# Patient Record
Sex: Female | Born: 1970 | Race: Black or African American | Hispanic: No | Marital: Married | Smoking: Never smoker
Health system: Southern US, Community
[De-identification: ages and names within clinical notes are randomized; demographics above are authoritative.]

## PROBLEM LIST (undated history)

## (undated) DIAGNOSIS — D219 Benign neoplasm of connective and other soft tissue, unspecified: Secondary | ICD-10-CM

## (undated) DIAGNOSIS — F419 Anxiety disorder, unspecified: Secondary | ICD-10-CM

## (undated) DIAGNOSIS — K819 Cholecystitis, unspecified: Secondary | ICD-10-CM

## (undated) DIAGNOSIS — I82409 Acute embolism and thrombosis of unspecified deep veins of unspecified lower extremity: Secondary | ICD-10-CM

## (undated) HISTORY — PX: TUBAL LIGATION: SHX77

## (undated) HISTORY — DX: Benign neoplasm of connective and other soft tissue, unspecified: D21.9

## (undated) HISTORY — PX: BREAST BIOPSY: SHX20

## (undated) HISTORY — DX: Acute embolism and thrombosis of unspecified deep veins of unspecified lower extremity: I82.409

## (undated) HISTORY — DX: Anxiety disorder, unspecified: F41.9

---

## 1998-05-14 ENCOUNTER — Encounter: Admission: RE | Admit: 1998-05-14 | Discharge: 1998-05-14 | Payer: Self-pay | Admitting: Family Medicine

## 1999-03-01 ENCOUNTER — Encounter: Admission: RE | Admit: 1999-03-01 | Discharge: 1999-03-01 | Payer: Self-pay | Admitting: Family Medicine

## 1999-04-08 ENCOUNTER — Encounter: Admission: RE | Admit: 1999-04-08 | Discharge: 1999-04-08 | Payer: Self-pay | Admitting: Family Medicine

## 2000-04-07 ENCOUNTER — Encounter: Admission: RE | Admit: 2000-04-07 | Discharge: 2000-04-07 | Payer: Self-pay | Admitting: Family Medicine

## 2000-11-06 ENCOUNTER — Emergency Department (HOSPITAL_COMMUNITY): Admission: EM | Admit: 2000-11-06 | Discharge: 2000-11-07 | Payer: Self-pay | Admitting: Emergency Medicine

## 2001-05-01 ENCOUNTER — Encounter: Admission: RE | Admit: 2001-05-01 | Discharge: 2001-05-01 | Payer: Self-pay | Admitting: Sports Medicine

## 2001-05-01 ENCOUNTER — Encounter: Payer: Self-pay | Admitting: Sports Medicine

## 2002-03-11 ENCOUNTER — Encounter: Admission: RE | Admit: 2002-03-11 | Discharge: 2002-03-11 | Payer: Self-pay | Admitting: Family Medicine

## 2002-04-21 ENCOUNTER — Emergency Department (HOSPITAL_COMMUNITY): Admission: EM | Admit: 2002-04-21 | Discharge: 2002-04-21 | Payer: Self-pay | Admitting: Emergency Medicine

## 2002-04-24 ENCOUNTER — Encounter: Admission: RE | Admit: 2002-04-24 | Discharge: 2002-04-24 | Payer: Self-pay | Admitting: Sports Medicine

## 2002-04-26 ENCOUNTER — Encounter: Admission: RE | Admit: 2002-04-26 | Discharge: 2002-04-26 | Payer: Self-pay | Admitting: Family Medicine

## 2003-05-07 ENCOUNTER — Emergency Department (HOSPITAL_COMMUNITY): Admission: EM | Admit: 2003-05-07 | Discharge: 2003-05-08 | Payer: Self-pay | Admitting: Emergency Medicine

## 2003-05-08 ENCOUNTER — Emergency Department (HOSPITAL_COMMUNITY): Admission: EM | Admit: 2003-05-08 | Discharge: 2003-05-08 | Payer: Self-pay | Admitting: Emergency Medicine

## 2003-05-09 ENCOUNTER — Encounter: Admission: RE | Admit: 2003-05-09 | Discharge: 2003-05-09 | Payer: Self-pay | Admitting: Sports Medicine

## 2003-06-06 ENCOUNTER — Encounter: Admission: RE | Admit: 2003-06-06 | Discharge: 2003-06-06 | Payer: Self-pay | Admitting: Family Medicine

## 2004-04-06 ENCOUNTER — Ambulatory Visit: Payer: Self-pay | Admitting: Sports Medicine

## 2004-04-08 ENCOUNTER — Emergency Department (HOSPITAL_COMMUNITY): Admission: EM | Admit: 2004-04-08 | Discharge: 2004-04-09 | Payer: Self-pay | Admitting: Emergency Medicine

## 2004-04-23 ENCOUNTER — Ambulatory Visit: Payer: Self-pay | Admitting: Family Medicine

## 2004-09-23 ENCOUNTER — Emergency Department (HOSPITAL_COMMUNITY): Admission: EM | Admit: 2004-09-23 | Discharge: 2004-09-23 | Payer: Self-pay | Admitting: Emergency Medicine

## 2004-09-29 ENCOUNTER — Ambulatory Visit: Payer: Self-pay | Admitting: Family Medicine

## 2004-10-29 ENCOUNTER — Ambulatory Visit: Payer: Self-pay | Admitting: Family Medicine

## 2004-11-06 ENCOUNTER — Encounter (INDEPENDENT_AMBULATORY_CARE_PROVIDER_SITE_OTHER): Payer: Self-pay | Admitting: *Deleted

## 2004-11-06 LAB — CONVERTED CEMR LAB

## 2004-12-06 ENCOUNTER — Ambulatory Visit: Payer: Self-pay | Admitting: Sports Medicine

## 2005-04-25 ENCOUNTER — Ambulatory Visit: Payer: Self-pay | Admitting: Family Medicine

## 2005-07-07 ENCOUNTER — Ambulatory Visit: Payer: Self-pay | Admitting: Family Medicine

## 2005-07-15 ENCOUNTER — Ambulatory Visit: Payer: Self-pay | Admitting: Sports Medicine

## 2005-07-21 ENCOUNTER — Ambulatory Visit: Payer: Self-pay | Admitting: Family Medicine

## 2005-09-12 ENCOUNTER — Ambulatory Visit: Payer: Self-pay | Admitting: Family Medicine

## 2006-09-01 ENCOUNTER — Encounter (INDEPENDENT_AMBULATORY_CARE_PROVIDER_SITE_OTHER): Payer: Self-pay | Admitting: Family Medicine

## 2006-09-01 ENCOUNTER — Encounter (INDEPENDENT_AMBULATORY_CARE_PROVIDER_SITE_OTHER): Payer: Self-pay | Admitting: *Deleted

## 2006-09-01 ENCOUNTER — Ambulatory Visit: Payer: Self-pay | Admitting: Family Medicine

## 2006-10-09 ENCOUNTER — Telehealth (INDEPENDENT_AMBULATORY_CARE_PROVIDER_SITE_OTHER): Payer: Self-pay | Admitting: *Deleted

## 2006-10-11 ENCOUNTER — Ambulatory Visit: Payer: Self-pay | Admitting: Family Medicine

## 2007-03-21 ENCOUNTER — Telehealth (INDEPENDENT_AMBULATORY_CARE_PROVIDER_SITE_OTHER): Payer: Self-pay | Admitting: *Deleted

## 2007-03-22 ENCOUNTER — Encounter (INDEPENDENT_AMBULATORY_CARE_PROVIDER_SITE_OTHER): Payer: Self-pay | Admitting: Family Medicine

## 2007-03-22 ENCOUNTER — Encounter (INDEPENDENT_AMBULATORY_CARE_PROVIDER_SITE_OTHER): Payer: Self-pay | Admitting: *Deleted

## 2007-03-22 ENCOUNTER — Ambulatory Visit: Payer: Self-pay | Admitting: Family Medicine

## 2007-03-22 LAB — CONVERTED CEMR LAB: Hgb A1c MFr Bld: 5.9 %

## 2007-03-29 ENCOUNTER — Encounter (INDEPENDENT_AMBULATORY_CARE_PROVIDER_SITE_OTHER): Payer: Self-pay | Admitting: Family Medicine

## 2007-04-04 ENCOUNTER — Ambulatory Visit (HOSPITAL_COMMUNITY): Admission: RE | Admit: 2007-04-04 | Discharge: 2007-04-04 | Payer: Self-pay | Admitting: Sports Medicine

## 2007-04-04 ENCOUNTER — Encounter: Payer: Self-pay | Admitting: Family Medicine

## 2007-04-04 ENCOUNTER — Encounter (INDEPENDENT_AMBULATORY_CARE_PROVIDER_SITE_OTHER): Payer: Self-pay | Admitting: Family Medicine

## 2007-04-04 LAB — CONVERTED CEMR LAB
ALT: 9 units/L (ref 0–35)
AST: 14 units/L (ref 0–37)
Albumin: 4.1 g/dL (ref 3.5–5.2)
Alkaline Phosphatase: 57 units/L (ref 39–117)
BUN: 12 mg/dL (ref 6–23)
Basophils Absolute: 0 10*3/uL (ref 0.0–0.1)
Basophils Relative: 1 % (ref 0–1)
CO2: 22 meq/L (ref 19–32)
Calcium: 9.3 mg/dL (ref 8.4–10.5)
Chloride: 105 meq/L (ref 96–112)
Cholesterol: 169 mg/dL (ref 0–200)
Creatinine, Ser: 0.86 mg/dL (ref 0.40–1.20)
Eosinophils Absolute: 0.1 10*3/uL (ref 0.0–0.7)
Eosinophils Relative: 1 % (ref 0–5)
Glucose, Bld: 81 mg/dL (ref 70–99)
HCT: 37 % (ref 36.0–46.0)
HDL: 35 mg/dL — ABNORMAL LOW (ref >39)
Hemoglobin: 11.6 g/dL — ABNORMAL LOW (ref 12.0–15.0)
LDL Cholesterol: 117 mg/dL — ABNORMAL HIGH (ref 0–99)
Lymphocytes Relative: 45 % (ref 12–46)
Lymphs Abs: 2.9 10*3/uL (ref 0.7–3.3)
MCHC: 31.4 g/dL (ref 30.0–36.0)
MCV: 78.7 fL (ref 78.0–100.0)
Monocytes Absolute: 0.6 10*3/uL (ref 0.2–0.7)
Monocytes Relative: 9 % (ref 3–11)
Neutro Abs: 2.9 10*3/uL (ref 1.7–7.7)
Neutrophils Relative %: 44 % (ref 43–77)
Platelets: 344 10*3/uL (ref 150–400)
Potassium: 4.4 meq/L (ref 3.5–5.3)
RBC: 4.7 M/uL (ref 3.87–5.11)
RDW: 15.1 % — ABNORMAL HIGH (ref 11.5–14.0)
Sodium: 136 meq/L (ref 135–145)
TSH: 0.823 microintl units/mL (ref 0.350–5.50)
Total Bilirubin: 0.5 mg/dL (ref 0.3–1.2)
Total CHOL/HDL Ratio: 4.8
Total Protein: 7.2 g/dL (ref 6.0–8.3)
Triglycerides: 86 mg/dL (ref <150)
VLDL: 17 mg/dL (ref 0–40)
WBC: 6.5 10*3/uL (ref 4.0–10.5)

## 2007-04-06 ENCOUNTER — Encounter (INDEPENDENT_AMBULATORY_CARE_PROVIDER_SITE_OTHER): Payer: Self-pay | Admitting: Family Medicine

## 2007-04-06 ENCOUNTER — Ambulatory Visit: Payer: Self-pay | Admitting: Family Medicine

## 2007-04-09 ENCOUNTER — Encounter (INDEPENDENT_AMBULATORY_CARE_PROVIDER_SITE_OTHER): Payer: Self-pay | Admitting: *Deleted

## 2007-04-09 LAB — CONVERTED CEMR LAB
Ferritin: 14 ng/mL (ref 10–291)
Iron: 37 ug/dL — ABNORMAL LOW (ref 42–145)
Saturation Ratios: 9 % — ABNORMAL LOW (ref 20–55)
TIBC: 423 ug/dL (ref 250–470)
UIBC: 386 ug/dL

## 2007-04-11 ENCOUNTER — Encounter (INDEPENDENT_AMBULATORY_CARE_PROVIDER_SITE_OTHER): Payer: Self-pay | Admitting: Family Medicine

## 2007-04-19 ENCOUNTER — Encounter: Payer: Self-pay | Admitting: Sports Medicine

## 2007-04-19 ENCOUNTER — Ambulatory Visit (HOSPITAL_COMMUNITY): Admission: RE | Admit: 2007-04-19 | Discharge: 2007-04-19 | Payer: Self-pay | Admitting: Sports Medicine

## 2007-04-24 ENCOUNTER — Encounter (INDEPENDENT_AMBULATORY_CARE_PROVIDER_SITE_OTHER): Payer: Self-pay | Admitting: Family Medicine

## 2008-06-12 ENCOUNTER — Emergency Department (HOSPITAL_COMMUNITY): Admission: EM | Admit: 2008-06-12 | Discharge: 2008-06-12 | Payer: Self-pay | Admitting: Emergency Medicine

## 2008-06-13 ENCOUNTER — Telehealth: Payer: Self-pay | Admitting: *Deleted

## 2008-06-16 ENCOUNTER — Encounter: Payer: Self-pay | Admitting: Family Medicine

## 2008-06-16 ENCOUNTER — Ambulatory Visit: Payer: Self-pay | Admitting: Family Medicine

## 2008-06-16 LAB — CONVERTED CEMR LAB
Basophils Absolute: 0 10*3/uL (ref 0.0–0.1)
Basophils Relative: 1 % (ref 0–1)
Eosinophils Absolute: 0.2 10*3/uL (ref 0.0–0.7)
Eosinophils Relative: 4 % (ref 0–5)
HCT: 35 % — ABNORMAL LOW (ref 36.0–46.0)
Hemoglobin: 10.5 g/dL — ABNORMAL LOW (ref 12.0–15.0)
Lymphocytes Relative: 32 % (ref 12–46)
Lymphs Abs: 1.5 10*3/uL (ref 0.7–4.0)
MCHC: 30 g/dL (ref 30.0–36.0)
MCV: 83.1 fL (ref 78.0–100.0)
Monocytes Absolute: 0.7 10*3/uL (ref 0.1–1.0)
Monocytes Relative: 15 % — ABNORMAL HIGH (ref 3–12)
Neutro Abs: 2.4 10*3/uL (ref 1.7–7.7)
Neutrophils Relative %: 50 % (ref 43–77)
Platelets: 316 10*3/uL (ref 150–400)
RBC: 4.21 M/uL (ref 3.87–5.11)
RDW: 14.5 % (ref 11.5–15.5)
TSH: 1.016 microintl units/mL (ref 0.350–4.50)
WBC: 4.9 10*3/uL (ref 4.0–10.5)

## 2008-06-20 ENCOUNTER — Encounter: Payer: Self-pay | Admitting: Family Medicine

## 2008-06-30 ENCOUNTER — Telehealth: Payer: Self-pay | Admitting: Family Medicine

## 2008-07-01 ENCOUNTER — Telehealth: Payer: Self-pay | Admitting: *Deleted

## 2008-07-04 HISTORY — PX: VAGINAL HYSTERECTOMY: SUR661

## 2008-07-11 ENCOUNTER — Encounter: Payer: Self-pay | Admitting: Family Medicine

## 2008-07-11 ENCOUNTER — Ambulatory Visit: Payer: Self-pay | Admitting: Family Medicine

## 2008-07-15 LAB — CONVERTED CEMR LAB
Ferritin: 7 ng/mL — ABNORMAL LOW (ref 10–291)
Iron: 37 ug/dL — ABNORMAL LOW (ref 42–145)
Pap Smear: NORMAL
Saturation Ratios: 9 % — ABNORMAL LOW (ref 20–55)
TIBC: 390 ug/dL (ref 250–470)
UIBC: 353 ug/dL

## 2008-07-30 ENCOUNTER — Telehealth: Payer: Self-pay | Admitting: Family Medicine

## 2008-07-31 ENCOUNTER — Telehealth (INDEPENDENT_AMBULATORY_CARE_PROVIDER_SITE_OTHER): Payer: Self-pay | Admitting: Family Medicine

## 2008-08-01 ENCOUNTER — Ambulatory Visit: Payer: Self-pay | Admitting: Family Medicine

## 2008-08-14 ENCOUNTER — Encounter: Payer: Self-pay | Admitting: Family Medicine

## 2008-08-24 ENCOUNTER — Encounter: Admission: RE | Admit: 2008-08-24 | Discharge: 2008-08-24 | Payer: Self-pay | Admitting: Otolaryngology

## 2008-10-23 ENCOUNTER — Telehealth: Payer: Self-pay | Admitting: Family Medicine

## 2008-10-23 ENCOUNTER — Encounter: Payer: Self-pay | Admitting: Family Medicine

## 2008-10-23 ENCOUNTER — Ambulatory Visit: Payer: Self-pay | Admitting: Family Medicine

## 2008-10-26 ENCOUNTER — Emergency Department (HOSPITAL_COMMUNITY): Admission: EM | Admit: 2008-10-26 | Discharge: 2008-10-26 | Payer: Self-pay | Admitting: Emergency Medicine

## 2008-10-26 ENCOUNTER — Telehealth (INDEPENDENT_AMBULATORY_CARE_PROVIDER_SITE_OTHER): Payer: Self-pay | Admitting: Family Medicine

## 2008-10-27 ENCOUNTER — Telehealth: Payer: Self-pay | Admitting: Family Medicine

## 2008-10-27 ENCOUNTER — Ambulatory Visit: Payer: Self-pay | Admitting: Family Medicine

## 2008-10-27 DIAGNOSIS — D259 Leiomyoma of uterus, unspecified: Secondary | ICD-10-CM | POA: Insufficient documentation

## 2008-10-28 ENCOUNTER — Telehealth: Payer: Self-pay | Admitting: Family Medicine

## 2008-10-28 ENCOUNTER — Telehealth: Payer: Self-pay | Admitting: *Deleted

## 2008-10-30 ENCOUNTER — Ambulatory Visit: Payer: Self-pay | Admitting: Obstetrics and Gynecology

## 2008-11-04 ENCOUNTER — Telehealth: Payer: Self-pay | Admitting: Family Medicine

## 2008-11-14 ENCOUNTER — Ambulatory Visit: Payer: Self-pay | Admitting: Obstetrics and Gynecology

## 2008-11-18 ENCOUNTER — Encounter: Payer: Self-pay | Admitting: Obstetrics and Gynecology

## 2008-11-18 ENCOUNTER — Ambulatory Visit: Payer: Self-pay | Admitting: Obstetrics and Gynecology

## 2008-11-18 ENCOUNTER — Encounter: Payer: Self-pay | Admitting: Family Medicine

## 2008-11-18 ENCOUNTER — Ambulatory Visit (HOSPITAL_BASED_OUTPATIENT_CLINIC_OR_DEPARTMENT_OTHER): Admission: RE | Admit: 2008-11-18 | Discharge: 2008-11-19 | Payer: Self-pay | Admitting: Obstetrics and Gynecology

## 2008-11-22 ENCOUNTER — Ambulatory Visit: Payer: Self-pay | Admitting: Obstetrics and Gynecology

## 2008-11-22 ENCOUNTER — Inpatient Hospital Stay (HOSPITAL_COMMUNITY): Admission: AD | Admit: 2008-11-22 | Discharge: 2008-11-22 | Payer: Self-pay | Admitting: Gynecology

## 2008-12-09 ENCOUNTER — Ambulatory Visit: Payer: Self-pay | Admitting: Obstetrics and Gynecology

## 2009-01-07 ENCOUNTER — Ambulatory Visit: Payer: Self-pay | Admitting: Obstetrics and Gynecology

## 2009-02-09 ENCOUNTER — Ambulatory Visit: Payer: Self-pay | Admitting: Family Medicine

## 2009-02-09 LAB — CONVERTED CEMR LAB
Bilirubin Urine: NEGATIVE
Glucose, Urine, Semiquant: NEGATIVE
Ketones, urine, test strip: NEGATIVE
Nitrite: NEGATIVE
Protein, U semiquant: >=300
Specific Gravity, Urine: 1.02
Urobilinogen, UA: 0.2
pH: 6.5

## 2009-02-10 ENCOUNTER — Encounter: Payer: Self-pay | Admitting: Family Medicine

## 2009-10-28 ENCOUNTER — Ambulatory Visit: Payer: Self-pay | Admitting: Obstetrics and Gynecology

## 2009-10-28 ENCOUNTER — Other Ambulatory Visit: Admission: RE | Admit: 2009-10-28 | Discharge: 2009-10-28 | Payer: Self-pay | Admitting: Obstetrics and Gynecology

## 2009-11-11 ENCOUNTER — Ambulatory Visit: Payer: Self-pay | Admitting: Obstetrics and Gynecology

## 2009-11-25 ENCOUNTER — Ambulatory Visit: Payer: Self-pay | Admitting: Obstetrics and Gynecology

## 2010-01-08 ENCOUNTER — Emergency Department (HOSPITAL_COMMUNITY): Admission: EM | Admit: 2010-01-08 | Discharge: 2010-01-09 | Payer: Self-pay | Admitting: Emergency Medicine

## 2010-05-07 ENCOUNTER — Ambulatory Visit: Payer: Self-pay | Admitting: Women's Health

## 2010-07-03 ENCOUNTER — Emergency Department (HOSPITAL_COMMUNITY)
Admission: EM | Admit: 2010-07-03 | Discharge: 2010-07-03 | Disposition: A | Discharge status: Other Acute Inpatient Hospital | Payer: Self-pay | Source: Home / Self Care | Admitting: Emergency Medicine

## 2010-07-03 ENCOUNTER — Inpatient Hospital Stay (HOSPITAL_COMMUNITY)
Admission: EM | Admit: 2010-07-03 | Discharge: 2010-07-06 | Payer: Self-pay | Attending: Family Medicine | Admitting: Family Medicine

## 2010-07-03 ENCOUNTER — Encounter (INDEPENDENT_AMBULATORY_CARE_PROVIDER_SITE_OTHER): Payer: Self-pay | Admitting: Emergency Medicine

## 2010-07-09 ENCOUNTER — Encounter: Payer: Self-pay | Admitting: *Deleted

## 2010-07-09 ENCOUNTER — Ambulatory Visit: Admit: 2010-07-09 | Payer: Self-pay

## 2010-07-09 ENCOUNTER — Ambulatory Visit: Admission: RE | Admit: 2010-07-09 | Discharge: 2010-07-09 | Payer: Self-pay | Source: Home / Self Care

## 2010-07-09 DIAGNOSIS — M5430 Sciatica, unspecified side: Secondary | ICD-10-CM | POA: Insufficient documentation

## 2010-07-09 DIAGNOSIS — I82409 Acute embolism and thrombosis of unspecified deep veins of unspecified lower extremity: Secondary | ICD-10-CM | POA: Insufficient documentation

## 2010-07-09 LAB — CONVERTED CEMR LAB: INR: 1.7

## 2010-07-13 ENCOUNTER — Ambulatory Visit: Admission: RE | Admit: 2010-07-13 | Discharge: 2010-07-13 | Payer: Self-pay | Source: Home / Self Care

## 2010-07-13 ENCOUNTER — Encounter: Payer: Self-pay | Admitting: Pharmacist

## 2010-07-13 LAB — CONVERTED CEMR LAB: INR: 1.8

## 2010-07-15 ENCOUNTER — Ambulatory Visit: Admission: RE | Admit: 2010-07-15 | Discharge: 2010-07-15 | Payer: Self-pay | Source: Home / Self Care

## 2010-07-15 ENCOUNTER — Telehealth: Payer: Self-pay | Admitting: Family Medicine

## 2010-07-19 ENCOUNTER — Ambulatory Visit: Admission: RE | Admit: 2010-07-19 | Discharge: 2010-07-19 | Payer: Self-pay | Source: Home / Self Care

## 2010-07-19 LAB — CONVERTED CEMR LAB: INR: 2.4

## 2010-07-26 ENCOUNTER — Ambulatory Visit: Admission: RE | Admit: 2010-07-26 | Discharge: 2010-07-26 | Payer: Self-pay | Source: Home / Self Care

## 2010-07-26 LAB — CONVERTED CEMR LAB: INR: 2.3

## 2010-07-30 NOTE — H&P (Signed)
Carla Alexander, HEPP              ACCOUNT NO.:  [id number]  MEDICAL RECORD NO.:  [id number]          PATIENT TYPE:  INP  LOCATION:  5501                         FACILITY:  [facility]  PHYSICIAN:  Annalysia Willenbring A. Sheffield Slider, M.D.    DATE OF BIRTH:  Jul 21, 1970  DATE OF ADMISSION:  07/03/2010 DATE OF DISCHARGE:                             HISTORY & PHYSICAL   PRIMARY CARE PHYSICIAN:  [facility].  CHIEF COMPLAINT:  Left leg pain.  HISTORY OF PRESENT ILLNESS:  Carla Alexander is a 40 year old female with no significant past medical history who presented earlier today to the [facility] ED for 3 weeks' history of pain in her left leg radiating from her left buttock to her thigh.  She also describes some shortness of breath and chest pain for about the same period of time.  She describes numbness and tingling in the long left lateral aspect of her thigh radiating to the whole foot and also radiating to her entire foot.It has been progressively worsened to the point where she came to the emergency department today.  She states that she walks with a limp secondary to pain, but does not need any assistance devices.  In the emergency room, she was diagnosed with left popliteal DVT.  A CT angiogram of the chest obtained and at that time it was negative PE. Denies any cough, palpitations, lower extremity swelling, abdominal pain, nausea, or vomiting.  PAST MEDICAL HISTORY: 1. Uterine fibroids. 2. Lumbago.  PAST SURGICAL HISTORY: 1. Bilateral tubal ligation in 1991. 2. Lumpectomy of left breast which is benign in 1991.  MEDICATIONS:  None.  ALLERGIES:  No known drug allergies.  FAMILY HISTORY:  History of postpartum cardiomyopathy.  Mother with diabetes mellitus type 2.  Sister also with diabetes mellitus type 2. No history of breast cancer.  SOCIAL HISTORY:  The patient is married and lives at home with 2 children and her husband.  She is a Geologist, engineering for kindergarten. Denies any  tobacco, ethanol, or drug abuse.  REVIEW OF SYSTEMS:  A 10-point review of systems is completely negative except for HPI.  PHYSICAL EXAMINATION:  GENERAL:  A well-developed, well-nourished, in no apparent distress female. HEENT:  Normocephalic and atraumatic.  PERRL.  EOMI.  Mucous membranes moist. NECK:  No lymphadenopathy.  Supple.  Full range of motion. CARDIOVASCULAR:  Regular rate and rhythm.  No murmurs.  Good S1 and S2. LUNGS:  Clear to auscultation bilaterally. ABDOMEN:  Obese, nontender, and nondistended.  Positive bowel sounds in all 4 quadrants. EXTREMITIES:  Warm and well perfused throughout.  Does have some mild tenderness on palpation of both calves and upper thigh of left leg. Unable to localize the exact pain.  Homans sign positive in left leg. No redness or edema noted in the bilateral lower extremities.  DP and PT pulses are +2 bilaterally. SKIN:  No rashes or lesions on visible skin. NEURO:  Alert and oriented x3.  Cranial nerves II-XII are intact. Sensation intact to upper and lower extremities.  Motor function is intact throughout.  DTRs are grossly diminished throughout all reflex areas.  Deferred gait exam.  Finger-nose  within normal limits. MUSCULOSKELETAL:  Strength 5/5 in the bilateral lower extremities except for left lower extremity.  Pain limits the strength examination in the left lower extremity.  The patient is hesitant to cooperate with exam secondary to this pain. PSYCH:  No suicidal or homicidal ideations.  Not depressed or anxious.  LABORATORY DATA:  BMET shows sodium 142, potassium 3.5, chloride 106, bicarb 26, BUN 7, creatinine 0.9, and glucose 107.  CBC showed WBC of 9.5, hemoglobin 12.4, hematocrit 38.6, and platelets 235.  IMAGING: 1. Lower extremity Doppler showed left popliteal DVT, preliminary     reading. 2. CT of the chest showed no evidence of PE with mildly prominent     thymic soft tissue for her age.  ASSESSMENT/PLAN:  Ms.  Carla Alexander is a 39-year female with new-onset left lower deep vein thrombosis, admission for the same. 1. Left deep vein thrombosis.  I will plan to start the patient on     Lovenox.  In the emergency room at [facility], we attempted     patient education.  The patient was unable to grasp giving herself     Lovenox injections.  Per the patient, no one as available to teach     her husband when he arrived.  She does believe he can learn Lovenox     injections to have her.  For now, we will bridge Lovenox to cover     until the therapeutic INR with Coumadin.  No evidence of PE on     exam.  The patient is no longer complaining of chest pain or     shortness of breath.  However with planning this today and     admission to emergency department.  Treatment for PE will be     anticoagulation anyway.  However, due to persistence of symptoms     and negative CTA, we will cycle cardiac enzymes.  I also plan to     risk stratify the patient further with A1c if she has not been seen     in clinic for over a year.  She has new-onset DVT.  I feel that TSH     and lipids would be better obtained when the patient is not in  distress. 2. Pain:  We will cover with Tylenol for now.  Pain does not seem to     related to location of DVT.  The patient is very upset about her     diagnosis and does not want to cooperate with physical exam,     examination of strength of left lower extremity.  We would like to     reevaluate with more complete exam in the morning when she has the     ability to fully process DVT diagnosis.  This sounds like sciatica     or piriformis syndrome with pain in her hip and paresthesias     radiating to back, thigh, and foot. 3. Fluids, electrolytes, nutrition and gastrointestinal:  Regular     adult diet. 4. Prophylaxis.  Lovenox per Pharmacy. 5. Disposition.  Hopeful for discharging home tomorrow after Lovenox     teaching.     Renold Don,  MD   ______________________________ Arnette Norris Sheffield Slider, M.D.    JW/MEDQ  D:  07/03/2010  T:  07/04/2010  Job:  [id number]  Electronically Signed by Renold Don  on 07/16/2010 08:49:21 AM Electronically Signed by Zachery Dauer M.D. on 07/30/2010 06:23:00 PM

## 2010-08-03 ENCOUNTER — Ambulatory Visit: Admission: RE | Admit: 2010-08-03 | Discharge: 2010-08-03 | Payer: Self-pay | Source: Home / Self Care

## 2010-08-03 LAB — CONVERTED CEMR LAB: INR: 2.2
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
## 2010-08-05 NOTE — Letter (Signed)
Summary: Out of Work  [facility]  [address]   [place], [place] [address]   Phone: [phone]  Fax: [phone]    July 09, 2010   Employee:  REYNA LORENZI    To Whom It May Concern:   For Medical reasons, please excuse the above named employee from work for the following dates:  Start:   July 09, 2010  End:   July 16, 2010  If you need additional information, please feel free to contact our office.         Sincerely,    Loralee Pacas CMA

## 2010-08-05 NOTE — Progress Notes (Signed)
Summary: Dental appt  Phone Note Call from Patient Call back at Home Phone [phone]   Reason for Call: Talk to Nurse Summary of Call: has dentist appt mon, pt is on a blood thinner & thinks she needs an abx before going to the dentist so she doesnt get an infection.  Initial call taken by: Knox Royalty,  July 15, 2010 3:07 PM  Follow-up for Phone Call        she has her regular checkup next week. denies hx of cardiac problems or valve replacement. states her sister told her she needed antibiotics. told her not needed if no problems as above. she takes warfarin. told her that would not make her need antibiotics. she was ok with answer Follow-up by: Golden Circle RN,  July 15, 2010 3:34 PM  Additional Follow-up for Phone Call Additional follow up Details #1::        Appreciate help.  Agree with plan.   Additional Follow-up by: Renold Don MD,  July 17, 2010 12:02 PM

## 2010-08-05 NOTE — Miscellaneous (Signed)
Summary: anticoagulation   Clinical Lists Changes  Patient arrives for INR assessment. INR was 1.8 and patient is currently taking 10 mg daily of warfarin. Asked to help give Lovenox injection. Patient's husband usually gives her injections at home. Gave 60 mg injection and 40 mg injection to make 100 mg.  Education and Injection instruction provided by Margot Chimes, PharmD candidate and Lyna Poser, PharmD   Medications: Added new medication of LOVENOX 100 MG/ML SOLN (ENOXAPARIN SODIUM) Inject 100 mg subcutaneously two times a day - Signed Added new medication of NITROSTAT 0.4 MG SUBL (NITROGLYCERIN) One by mouth sublingual q23min x 3 for chest pain. - Signed Rx of LOVENOX 100 MG/ML SOLN (ENOXAPARIN SODIUM) Inject 100 mg subcutaneously two times a day;  #1 x 0;  Signed;  Entered by: Lyna Poser PharmD;  Authorized by: Lyna Poser PharmD;  Method used: Historical Rx of NITROSTAT 0.4 MG SUBL (NITROGLYCERIN) One by mouth sublingual q49min x 3 for chest pain.;  #1 box x 0;  Signed;  Entered by: Rodney Langton MD;  Authorized by: Rodney Langton MD;  Method used: [facility]  [address] [phone]*, [address], [place], [place], [place]  [id number], Ph: [phone] or [phone], Fax: [phone]    Prescriptions: NITROSTAT 0.4 MG SUBL (NITROGLYCERIN) One by mouth sublingual q10min x 3 for chest pain.  #1 box x 0   Entered and Authorized by:   Rodney Langton MD   Signed by:   Rodney Langton MD on 07/13/2010   Method used:   [facility]        [facility]  [address] [phone]* (retail)       [address]       [place], [place]  [id number]       Ph: [phone] or [phone]       Fax: [phone]   RxID:   [id number] LOVENOX 100 MG/ML SOLN (ENOXAPARIN SODIUM) Inject 100 mg subcutaneously two times a day  #1 x 0   Entered and Authorized by:   Lyna Poser PharmD   Signed by:   Rodney Langton MD  on 07/13/2010   Method used:   Historical   RxID:   [id number]

## 2010-08-05 NOTE — Assessment & Plan Note (Signed)
Summary: needs rx/meet new md/INR ck/eo   Vital Signs:  Patient profile:   40 year old female Height:      68.5 inches Weight:      250 pounds BMI:     37.59 Temp:     98.6 degrees F oral Pulse rate:   89 / minute Pulse rhythm:   regular BP sitting:   140 / 90  (right arm) Cuff size:   regular  Vitals Entered By: Loralee Pacas CMA (July 15, 2010 8:53 AM) CC: meet new pcp and rx Pain Assessment Patient in pain? yes        Primary Care Provider:  . [facility]  CC:  meet new pcp and rx.  History of Present Illness: 1.  DVT FU:  Still taking Wafarin.  INR 1.5 today, was 1.7 on 1/10.  Taking 10 mg daily.  Also on Lovenox.    2.  Chest Pain:  Had episode of 10 minutes of chest pain after leaving [facility] on 1/10.  Tightness in her chest, felt like she couldn't talk or swallow.  No shortness of breath.  Complained of dizziness.  Also having similar symptoms in hospital.  Was given Nitroglycerin at that appt but did not take it.  Normal stress test Oct 2008 in cardiology office.  Relieved with rest.  No further symptoms.  No diaphoresis or N/V.  3.  Pain:  still with pain in hip and radiating down leg in sciatic distribution.  Feels it is worse today than yesterday.  Has been doing stretches.  Would like refill on hydrocodone.    ROS:  no headaches, pre-syncopal or syncopal episodes,  palpitations, shortness of breath or dyspnea, abdominal pain, diarrhea or constipation, melena, hematochezia, lower extremity swelling.    Habits & Providers  Alcohol-Tobacco-Diet     Tobacco Status: never  Current Problems (verified): 1)  Long-term (CURRENT) Use of Anticoagulants  (ICD-V58.61) 2)  Sciatica, Left  (ICD-724.3) 3)  Deep Venous Thrombophlebitis, Leg, Left  (ICD-453.40) 4)  Fibroids, Uterus  (ICD-218.9)  Current Medications (verified): 1)  Lovenox 100 Mg/ml Soln (Enoxaparin Sodium) .... Inject 100 Mg Subcutaneously Two Times A Day 2)  Hydrocodone-Acetaminophen 5-325 Mg Tabs  (Hydrocodone-Acetaminophen) .... Take 1 Pill Every 6 Hours If Needed For Pain 3)  Warfarin Sodium 10 Mg Tabs (Warfarin Sodium)  Allergies (verified): No Known Drug Allergies  Past History:  Past Surgical History: - ECHO: EF 60 %, no ventricular wall motion abnormalities. Normal anatomy( 10/08) -BTL - 12/02/1989 - lumpectomy-left breast-benign (1991)  - Normal Stress test Oct 2008  Physical Exam  General:  Vital signs reviewed. Well-developed, well-nourished patient in NAD.  Awake and cooperative  Lungs:  clear to auscultation bilaterally without wheezing, rales, or rhonchi.  Normal work of breathing  Heart:  Regular rate and rhythm without murmur, rub, or gallop.  Normal S1/S2  Msk:  LLE slightly swollen compared to right, homans sign positive on L, TTP in popliteal region.    Strength, sensation, and DTRs 5/5 and symmetric. Straight leg raise was positive for back pain. Her hip abductors and ext rotators are extremely tight.  Exam is severely limited 2/2 this. She is very tender over her piriformis and pressure reproduces neuropathic symptoms.  Pulses:  Good distal pulses BL LE's Neurologic:  sensation intact to light touch bilateral lower extremities and DTRs symmetrical and normal.     Impression & Recommendations:  Problem # 1:  DEEP VENOUS THROMBOPHLEBITIS, LEG, LEFT (ICD-453.40)  Increase Coumadin to 15 mg  x 2 days then back to 10.  FU next week lab appt.   Orders: [facility]- Est  Level 4 ([address]214)  Problem # 2:  SCIATICA, LEFT (ICD-724.3)  Recommended to continue stretches as prescribed.  Recommended physical therapy but patient declined at this time, will think about it in future.  Orders: [facility]- Est  Level 4 (04540)  Orders: [facility]- Est  Level 4 (98119)  Problem # 3:  CHEST PAIN (ICD-786.50)  Not cardiac pain.  Likely related to anxiety.  Not related to food intake.  Initially concerned for PE in patient with new DVT, but she has been taking Lovenox regularly and less  concerned after taking full history.  Gave strict red flags and reasons to call clinic or go to ED.  Low threshold for fu for chest pain.    Orders: [facility]- Est  Level 4 ([address]214)  Complete Medication List: 1)  Lovenox 100 Mg/ml Soln (Enoxaparin sodium) .... Inject 100 mg subcutaneously two times a day 2)  Hydrocodone-acetaminophen 5-325 Mg Tabs (Hydrocodone-acetaminophen) .... Take 1 pill every 6 hours if needed for pain 3)  Warfarin Sodium 10 Mg Tabs (Warfarin sodium)  Patient Instructions: 1)  We are refilling your pain medicines today. 2)  Keep up with being active and your stretching to help with the sciatic pain.  3)  Take the Warfarin as you discussed with Kendal Hymen.   4)  Make an appt to see me in 1 month so we can go over all of your medical history at that time and make sure you're doing well. 5)  If you're having any further chest pain, make sure you come back so we can see you then.  If you're having bad chest pain or trouble breathing, go to the ED.  6)  It was good to see you today! Prescriptions: HYDROCODONE-ACETAMINOPHEN 5-325 MG TABS (HYDROCODONE-ACETAMINOPHEN) Take 1 pill every 6 hours if needed for pain  #45 x 0   Entered and Authorized by:   Renold Don MD   Signed by:   Renold Don MD on 07/15/2010   Method used:   Print then Give to Patient   RxID:   [id number]    Orders Added: 1)  [facility]- Est  Level 4 [[address]]  Appended Document: INR  1.5    Lab Visit  Laboratory Results   Blood Tests      INR: 1.5   (Normal Range: 0.88-1.12   Therap INR: 2.0-3.5)   Orders Today:    ANTICOAGULATION RECORD PREVIOUS REGIMEN & LAB RESULTS Anticoagulation Diagnosis:  Deep venous thrombosis on  07/09/2010 Previous INR Goal Range:  2-3 on  07/09/2010 Previous INR:  1.8 on  07/13/2010 Previous Coumadin Dose(mg):  10mg  tablets on  07/09/2010 Previous Regimen:  10 mg warfarin daily;  100 mg lovenox BID on  07/13/2010  NEW REGIMEN & LAB RESULTS Current INR:  1.5 Regimen: 15 mg - today (Thurs),  10 mg - Fri & Sat;  15 mg - Sun;  continue Lovenox 100 mg BID Coagulation Comments: gave pt.

## 2010-08-13 ENCOUNTER — Ambulatory Visit (INDEPENDENT_AMBULATORY_CARE_PROVIDER_SITE_OTHER): Payer: BC Managed Care – PPO | Admitting: *Deleted

## 2010-08-13 ENCOUNTER — Encounter: Payer: Self-pay | Admitting: Family Medicine

## 2010-08-13 ENCOUNTER — Ambulatory Visit (INDEPENDENT_AMBULATORY_CARE_PROVIDER_SITE_OTHER): Payer: BC Managed Care – PPO | Admitting: Family Medicine

## 2010-08-13 VITALS — BP 149/95 | HR 67 | Temp 98.5°F | Ht 68.5 in | Wt 253.7 lb

## 2010-08-13 DIAGNOSIS — I82409 Acute embolism and thrombosis of unspecified deep veins of unspecified lower extremity: Secondary | ICD-10-CM

## 2010-08-13 DIAGNOSIS — R519 Headache, unspecified: Secondary | ICD-10-CM | POA: Insufficient documentation

## 2010-08-13 DIAGNOSIS — R51 Headache: Secondary | ICD-10-CM

## 2010-08-13 DIAGNOSIS — M5430 Sciatica, unspecified side: Secondary | ICD-10-CM

## 2010-08-13 DIAGNOSIS — D259 Leiomyoma of uterus, unspecified: Secondary | ICD-10-CM

## 2010-08-13 DIAGNOSIS — Z7901 Long term (current) use of anticoagulants: Secondary | ICD-10-CM

## 2010-08-13 LAB — PROTIME-INR: INR: 2.1 — AB (ref 0.9–1.1)

## 2010-08-13 MED ORDER — IBUPROFEN 600 MG PO TABS: 600.0000 mg | ORAL_TABLET | Freq: Three times a day (TID) | ORAL | Status: DC | PRN

## 2010-08-13 NOTE — Patient Instructions (Signed)
Keep taking your Coumadin everyday to help keep your blood thin. Keep taking the Ibuprofen for pain relief as needed.  We will check your INR today. Come back in 2-3 months so we can make sure you're still doing well.

## 2010-08-13 NOTE — Assessment & Plan Note (Signed)
Plan to continue Coumadin.  Will recheck INR today.  Improving regarding pain, swelling.   No further symptoms.   Discussed worrisome signs and symptoms with patient today, reasons to return or go to ED.

## 2010-08-13 NOTE — Assessment & Plan Note (Signed)
Patient s/p hysterectomy.  Problem resolved.

## 2010-08-13 NOTE — Progress Notes (Signed)
  Subjective:    Patient ID: Carla Alexander, female    DOB: 1970-09-10, 40 y.o.   MRN: [id number]  HPI 1.  LE clot:  1st time clot last month.  Taking 10 mg Coumadin daily.  Was having much pain in back of knee and calf, much improved.  No more swelling.  Taking Ibuprofen for sciatica, which also helps for pain with clot.  No chest pain, shortness of breath, palpitations, tachypnea.    2.  Sciatica:  Still with pain, similar to before.  Starts in lower back and radiates through hip to back of thigh.  Relieved with stretching and Ibuprofen.  States it's much improved since being seen last visit.    3.  Headache:  Started having a headache about 3 days ago.  Has not had problems with headaches in past.  Describes as dull ache in back of head.  Comes and goes throughout the day, currently having one right now.  No associated symptoms:  No aura, no nausea/vomiting/abdominal pain.  No changes in vision, has not been checked recently for need for glasses.      Review of Systems    See above Objective:   Physical Exam  Constitutional: She is oriented to person, place, and time. She appears well-developed and well-nourished.  HENT:  Head: Normocephalic and atraumatic.  Right Ear: External ear normal.  Left Ear: External ear normal.  Mouth/Throat: Oropharynx is clear and moist.  Eyes: Conjunctivae and EOM are normal. Pupils are equal, round, and reactive to light. Left eye exhibits no discharge. No scleral icterus.  Neck: Normal range of motion. Neck supple. No thyromegaly present.  Cardiovascular: Normal rate, regular rhythm, normal heart sounds and intact distal pulses.   No murmur heard. Pulmonary/Chest: Effort normal and breath sounds normal. No respiratory distress. She has no wheezes.  Lymphadenopathy:    She has no cervical adenopathy.  Neurological: She is alert and oriented to person, place, and time. She has normal strength and normal reflexes. She displays normal reflexes. No cranial  nerve deficit or sensory deficit. She displays a negative Romberg sign.          Assessment & Plan:

## 2010-08-13 NOTE — Assessment & Plan Note (Signed)
Improved as well.  Still with pain, though helped by stretching and physical activity. Recommended to continue activity, stretching.   Ibuprofen for relief.   No worrisome signs or symptoms.

## 2010-08-19 ENCOUNTER — Other Ambulatory Visit: Payer: Self-pay

## 2010-08-26 ENCOUNTER — Telehealth: Payer: Self-pay | Admitting: Family Medicine

## 2010-08-26 MED ORDER — WARFARIN SODIUM 10 MG PO TABS: 10.0000 mg | ORAL_TABLET | Freq: Every day | ORAL | Status: DC

## 2010-08-26 NOTE — Telephone Encounter (Signed)
Pt just realized she only has 1 more dose of warfarin left, is asking if we can call in rx to [address].

## 2010-08-26 NOTE — Telephone Encounter (Signed)
Will route to the preceptor.

## 2010-08-27 ENCOUNTER — Ambulatory Visit (INDEPENDENT_AMBULATORY_CARE_PROVIDER_SITE_OTHER): Payer: BC Managed Care – PPO | Admitting: *Deleted

## 2010-08-27 DIAGNOSIS — Z7901 Long term (current) use of anticoagulants: Secondary | ICD-10-CM

## 2010-08-27 DIAGNOSIS — I82409 Acute embolism and thrombosis of unspecified deep veins of unspecified lower extremity: Secondary | ICD-10-CM

## 2010-08-27 LAB — PROTIME-INR: INR: 2.2 — AB (ref 0.9–1.1)

## 2010-09-03 ENCOUNTER — Ambulatory Visit: Payer: BC Managed Care – PPO

## 2010-09-13 LAB — DIFFERENTIAL
Eosinophils Absolute: 0.1 10*3/uL (ref 0.0–0.7)
Lymphs Abs: 4.2 10*3/uL — ABNORMAL HIGH (ref 0.7–4.0)
Monocytes Relative: 6 % (ref 3–12)
Neutro Abs: 4.6 10*3/uL (ref 1.7–7.7)
Neutrophils Relative %: 49 % (ref 43–77)

## 2010-09-13 LAB — CBC
HCT: 38.6 % (ref 36.0–46.0)
Hemoglobin: 12.4 g/dL (ref 12.0–15.0)
MCH: 25.9 pg — ABNORMAL LOW (ref 26.0–34.0)
MCH: 26.8 pg (ref 26.0–34.0)
MCHC: 31.8 g/dL (ref 30.0–36.0)
MCV: 81.4 fL (ref 78.0–100.0)
Platelets: 298 10*3/uL (ref 150–400)
RBC: 4.62 MIL/uL (ref 3.87–5.11)

## 2010-09-13 LAB — PROTIME-INR
INR: 1.01 (ref 0.00–1.49)
INR: 1.11 (ref 0.00–1.49)
Prothrombin Time: 13.5 seconds (ref 11.6–15.2)
Prothrombin Time: 13.6 seconds (ref 11.6–15.2)
Prothrombin Time: 14.5 seconds (ref 11.6–15.2)

## 2010-09-13 LAB — CARDIAC PANEL(CRET KIN+CKTOT+MB+TROPI)
CK, MB: 1.2 ng/mL (ref 0.3–4.0)
Relative Index: 1.1 (ref 0.0–2.5)
Total CK: 108 U/L (ref 7–177)

## 2010-09-13 LAB — POCT I-STAT, CHEM 8
BUN: 7 mg/dL (ref 6–23)
Glucose, Bld: 107 mg/dL — ABNORMAL HIGH (ref 70–99)
HCT: 41 % (ref 36.0–46.0)
Hemoglobin: 13.9 g/dL (ref 12.0–15.0)

## 2010-09-13 LAB — HEMOGLOBIN A1C: Hgb A1c MFr Bld: 6 % — ABNORMAL HIGH (ref <5.7)

## 2010-09-17 ENCOUNTER — Other Ambulatory Visit: Payer: Self-pay | Admitting: Family Medicine

## 2010-09-17 NOTE — Telephone Encounter (Signed)
Refill request

## 2010-09-19 LAB — POCT I-STAT, CHEM 8
BUN: 10 mg/dL (ref 6–23)
Creatinine, Ser: 0.8 mg/dL (ref 0.4–1.2)
Potassium: 4.1 mEq/L (ref 3.5–5.1)
Sodium: 138 mEq/L (ref 135–145)

## 2010-09-21 ENCOUNTER — Telehealth: Payer: Self-pay | Admitting: Family Medicine

## 2010-09-21 DIAGNOSIS — I82409 Acute embolism and thrombosis of unspecified deep veins of unspecified lower extremity: Secondary | ICD-10-CM

## 2010-09-21 MED ORDER — WARFARIN SODIUM 10 MG PO TABS
10.0000 mg | ORAL_TABLET | Freq: Every day | ORAL | Status: DC
Start: 2010-09-21 — End: 2010-12-29

## 2010-09-21 NOTE — Telephone Encounter (Signed)
States she takes 1 1/2 pills of Warfarin every other day and she [facility] [address]

## 2010-09-21 NOTE — Telephone Encounter (Signed)
Refilled meds. Thanks!

## 2010-09-21 NOTE — Telephone Encounter (Signed)
Will route to PCP 

## 2010-09-24 ENCOUNTER — Ambulatory Visit (INDEPENDENT_AMBULATORY_CARE_PROVIDER_SITE_OTHER): Payer: BC Managed Care – PPO | Admitting: *Deleted

## 2010-09-24 DIAGNOSIS — Z7901 Long term (current) use of anticoagulants: Secondary | ICD-10-CM

## 2010-09-24 DIAGNOSIS — I82409 Acute embolism and thrombosis of unspecified deep veins of unspecified lower extremity: Secondary | ICD-10-CM

## 2010-09-24 NOTE — Progress Notes (Signed)
Patient in today for PT/ INR and she reports swelling in left lower leg and foot off and on for past week.  Denies any pain. Swelling does go down at night. Carla Alexander   Consulted with Dr. 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 Needs appointment with PCP to discuss this. Appointment scheduled 09/30/2010. Advised if she developes pain or swelling worsens to call us.

## 2010-09-30 ENCOUNTER — Encounter: Payer: Self-pay | Admitting: Family Medicine

## 2010-09-30 ENCOUNTER — Ambulatory Visit (INDEPENDENT_AMBULATORY_CARE_PROVIDER_SITE_OTHER): Payer: BC Managed Care – PPO | Admitting: Family Medicine

## 2010-09-30 VITALS — BP 120/74 | HR 80 | Wt 261.0 lb

## 2010-09-30 DIAGNOSIS — R202 Paresthesia of skin: Secondary | ICD-10-CM

## 2010-09-30 DIAGNOSIS — I82409 Acute embolism and thrombosis of unspecified deep veins of unspecified lower extremity: Secondary | ICD-10-CM

## 2010-09-30 DIAGNOSIS — D649 Anemia, unspecified: Secondary | ICD-10-CM

## 2010-09-30 DIAGNOSIS — R209 Unspecified disturbances of skin sensation: Secondary | ICD-10-CM

## 2010-09-30 NOTE — Progress Notes (Signed)
  Subjective:    Patient ID: Carla Alexander, female    DOB: 24-Dec-1970, 40 y.o.   MRN: [id number]  HPI 1.  DVT follow-up:  DVT in Left popliteal region, diagnosed 07/03/10.  Taking Coumadin daily, recent INR checks have all been WNL.  Describes swelling in calf and ankle at times, variable when it occurs, sometimes after sitting, other times when she is on her feet for a while.  Has not tried anything for relief.  Wants to know why it is still swollen at times.   2.  Foot numbness:  Describes numbness in base of foot after she has been sitting a while.  Some pain in base of foot as well.  Has to "shake out" foot and it improves.  Is walking for exercise and feels this helps.  Denies any paresthesias.  No trauma to area.  No other numbness, paresthesias, weakness in other extremities or changes in vision.     3.  Anemia:  Diagnosed with low iron while in hospital.  Normal Hgb prior to admission.  She would like to have iron checked again to make sure it is okay.  Was prescribed iron tablets but not taking them.  Denies any fatigue, chest pain, palpitations, prolonged or excessive bleeding.     Review of Systems     Objective:   Physical Exam Gen:  Alert, cooperative patient who appears stated age in no acute distress.  Vital signs reviewed. Cardiac:  Regular rate and rhythm without murmur auscultated.  Good S1/S2.  Good distal pulses BL LE's. Pulm:  Clear to auscultation bilaterally with good air movement.  No wheezes or rales noted.   Ext:  No clubbing/cyanosis/erythema. No edema noted RLE.  Tape measure used:  Right calf 2 cm below patella measured 46 cm in, Left calf measured 46 1/2 inches.  Ankles measured 1 cm above malleolus BL was Right ankle: 23 cm, L ankle 23 1/2 cm.  No appreciable pitting noted.   Neuro:  No focal deficits upper, lower extremities.  No decrease sensation Left foot.  No motor weakness.           Assessment & Plan:

## 2010-10-01 DIAGNOSIS — R202 Paresthesia of skin: Secondary | ICD-10-CM | POA: Insufficient documentation

## 2010-10-01 DIAGNOSIS — D649 Anemia, unspecified: Secondary | ICD-10-CM | POA: Insufficient documentation

## 2010-10-01 NOTE — Assessment & Plan Note (Signed)
DVT 07/03/10.  Will need 6-9 months Coumadin.  Continue Warfarin today. Physical exam showed trace to minimal edema LLE.   Will write for TED hose to help with edema.   Does not want or warrant anything for pain.   To fu in 3 months for improvement.

## 2010-10-12 LAB — URINALYSIS, ROUTINE W REFLEX MICROSCOPIC
Glucose, UA: NEGATIVE mg/dL
Specific Gravity, Urine: 1.01 (ref 1.005–1.030)
pH: 6 (ref 5.0–8.0)

## 2010-10-12 LAB — CBC
MCV: 79.3 fL (ref 78.0–100.0)
Platelets: 280 10*3/uL (ref 150–400)
WBC: 8.6 10*3/uL (ref 4.0–10.5)

## 2010-10-12 LAB — URINE CULTURE: Colony Count: >=100000

## 2010-10-12 LAB — URINE MICROSCOPIC-ADD ON

## 2010-10-13 LAB — COMPREHENSIVE METABOLIC PANEL
ALT: 12 U/L (ref 0–35)
Calcium: 9.2 mg/dL (ref 8.4–10.5)
Creatinine, Ser: 0.74 mg/dL (ref 0.4–1.2)
Glucose, Bld: 96 mg/dL (ref 70–99)
Sodium: 138 mEq/L (ref 135–145)
Total Protein: 6.5 g/dL (ref 6.0–8.3)

## 2010-10-13 LAB — CBC
Hemoglobin: 10.6 g/dL — ABNORMAL LOW (ref 12.0–15.0)
MCHC: 33.4 g/dL (ref 30.0–36.0)
RDW: 14.9 % (ref 11.5–15.5)

## 2010-10-13 LAB — URINALYSIS, ROUTINE W REFLEX MICROSCOPIC
Glucose, UA: NEGATIVE mg/dL
Nitrite: NEGATIVE
Specific Gravity, Urine: 1.016 (ref 1.005–1.030)
pH: 7.5 (ref 5.0–8.0)

## 2010-10-13 LAB — DIFFERENTIAL
Eosinophils Absolute: 0 10*3/uL (ref 0.0–0.7)
Lymphocytes Relative: 38 % (ref 12–46)
Lymphs Abs: 2 10*3/uL (ref 0.7–4.0)
Monocytes Relative: 10 % (ref 3–12)
Neutro Abs: 2.7 10*3/uL (ref 1.7–7.7)
Neutrophils Relative %: 51 % (ref 43–77)

## 2010-10-13 LAB — PREGNANCY, URINE: Preg Test, Ur: NEGATIVE

## 2010-10-21 ENCOUNTER — Ambulatory Visit (INDEPENDENT_AMBULATORY_CARE_PROVIDER_SITE_OTHER): Payer: BC Managed Care – PPO | Admitting: *Deleted

## 2010-10-21 ENCOUNTER — Ambulatory Visit: Payer: BC Managed Care – PPO

## 2010-10-21 DIAGNOSIS — Z7901 Long term (current) use of anticoagulants: Secondary | ICD-10-CM

## 2010-10-21 DIAGNOSIS — I82409 Acute embolism and thrombosis of unspecified deep veins of unspecified lower extremity: Secondary | ICD-10-CM

## 2010-10-21 LAB — POCT INR: INR: 2.2

## 2010-11-16 NOTE — Op Note (Signed)
Carla Alexander, ABRAMOVICH              ACCOUNT NO.:  [id number]   MEDICAL RECORD NO.:  [id number]          PATIENT TYPE:  AMB   LOCATION:  [facility]                         FACILITY:  [facility]   PHYSICIAN:  Daniel L. Gottsegen, M.D.DATE OF BIRTH:  1971/02/12   DATE OF PROCEDURE:  11/18/2008  DATE OF DISCHARGE:                               OPERATIVE REPORT   PREOPERATIVE DIAGNOSES:  Fibroids with dysfunctional uterine bleeding,  dysmenorrhea and anemia.   POSTOPERATIVE DIAGNOSES:  Fibroids with dysfunctional uterine bleeding,  dysmenorrhea and anemia.   NAME OF OPERATION:  Laparoscopic-assisted vaginal hysterectomy.   SURGEON:  Daniel L. Eda Paschal, M.D.   FIRST ASSISTANT:  Timothy P. Fontaine, M.D.   INDICATIONS:  The patient is a 40 year old gravida 2, para 2, AB 0 with  large myomas, having menorrhagia, dysmenorrhea and anemia.  She has not  responded to medical therapy and she now enters the hospital for  laparoscopic-assisted vaginal hysterectomy because of the above.  The  patient has at least 4 distinct myomas on ultrasound, the largest 2 are  6 cm each   FINDINGS AT THE TIME OF SURGERY:  The patient's uterus was significantly  enlarged by myomas to at least 10-12 weeks' size.  Ovaries, fallopian  tubes and pelvic peritoneum were free of disease.  There was one area on  the vaginal wall mucosa that appeared to be a small area of  endometriosis of about 2 mm.  Prior to the procedure it was noted that  the patient has had an umbilical hernia.  We had not discussed with her  fixing this and this was again present preoperatively before the  procedure started.  Ovaries and fallopian tubes as noted above were  normal.   PROCEDURE:  After adequate general endotracheal anesthesia, the patient  was placed in the dorsal lithotomy position, prepped and draped in the  usual sterile manner.  A Foley catheter was inserted in the patient's  bladder.  A Hulka catheter was placed in the uterus.   Using the  Optiview, direct insertion of the 10-mm laparoscope was placed through a  subumbilical incision. A pneumoperitoneum was created. This was placed  atraumatically.  Two 5-mm ports were placed in the pelvis.  Using a  variety of instruments including the ligature, the adnexa were separated  from the uterus using ligature, and leaving both ovaries in place.  This  was extended until the round ligaments were separated with a ligature.  A bladder flap was developed.  Part of the uterine arteries was also  bipolared with the ligature and at this point the surgeons went below.  A 1:200,000 solution of epinephrine and 0.5% Xylocaine was utilized.  A  360-degree incision was made around the cervix.  The bladder was  mobilized superiorly, as was the posterior peritoneum.  Posterior  peritoneum and vesicouterine fold of peritoneum were sharply opened  without trauma.  Uterosacral ligaments were clamped.  In clamping them,  they were shortened.  They were sutured to the vault laterally for good  vault support.  Cardinal ligaments, uterine arteries and balance of the  broad ligament was clamped,  cut and suture ligated.  Suture material for  all pedicles was #1 chromic catgut.  Multiple myomectomies and  morcellation was done in order to get the specimen through the vagina.  Total weight of the uterus was 440 g.  Finally, the uterus was  delivered.  The vaginal cuff was whipstitched to the posterior  peritoneum with 2 running locking 0 Vicryls.  Copious irrigation was  done with sterile saline.  A modified McCall enterocele suture was  placed with 3-0 Vicryl and then the cuff and the peritoneum was closed  with figure-of-eights of #1 chromic. The area of endometriosis was  cauterized until it was gone. The surgeons regloved, went above.  There  were several little oozing areas from the previous surgery and these  were controlled with bipolar.  Copious irrigation was once again done  and then  all cul-de-sac fluid was removed.  The subumbilical fascial  incision was closed with 0 Vicryl.  It was below where the patient had a  preexisting umbilical hernia.  Then Dermabond was used to close the 3  skin incisions.  Blood loss was approximately 200 mL with none replaced.  The patient tolerated the procedure well and left the operating room in  satisfactory condition, draining clear urine from her Foley catheter.      Daniel L. Eda Paschal, M.D.  Electronically Signed     DLG/MEDQ  D:  11/18/2008  T:  11/18/2008  Job:  [id number]

## 2010-11-18 ENCOUNTER — Ambulatory Visit (INDEPENDENT_AMBULATORY_CARE_PROVIDER_SITE_OTHER): Payer: BC Managed Care – PPO | Admitting: *Deleted

## 2010-11-18 DIAGNOSIS — Z7901 Long term (current) use of anticoagulants: Secondary | ICD-10-CM

## 2010-11-18 DIAGNOSIS — I82409 Acute embolism and thrombosis of unspecified deep veins of unspecified lower extremity: Secondary | ICD-10-CM

## 2010-11-18 LAB — POCT INR: INR: 2.5

## 2010-12-16 ENCOUNTER — Ambulatory Visit (INDEPENDENT_AMBULATORY_CARE_PROVIDER_SITE_OTHER): Payer: BC Managed Care – PPO | Admitting: *Deleted

## 2010-12-16 DIAGNOSIS — I82409 Acute embolism and thrombosis of unspecified deep veins of unspecified lower extremity: Secondary | ICD-10-CM

## 2010-12-16 DIAGNOSIS — Z7901 Long term (current) use of anticoagulants: Secondary | ICD-10-CM

## 2010-12-16 LAB — POCT INR: INR: 2.3

## 2010-12-29 ENCOUNTER — Other Ambulatory Visit: Payer: Self-pay | Admitting: Family Medicine

## 2010-12-29 ENCOUNTER — Telehealth: Payer: Self-pay | Admitting: Family Medicine

## 2010-12-29 DIAGNOSIS — I82409 Acute embolism and thrombosis of unspecified deep veins of unspecified lower extremity: Secondary | ICD-10-CM

## 2010-12-29 NOTE — Telephone Encounter (Signed)
Please send refill to pharmacy making correction to dosage.  Prescription is showing to take one daily or 1 1/2 as needed.  Pharmacy need new rx stating definite 1 1/2 daily.  Bonnie from lab says pt is also to take 1 1/2 3 times weekly.  If taking the way now prescribed will run out before month ends.  Please increase quantity of prescription.

## 2010-12-29 NOTE — Telephone Encounter (Signed)
Refill request

## 2010-12-31 MED ORDER — WARFARIN SODIUM 10 MG PO TABS: ORAL_TABLET | ORAL | Status: DC

## 2010-12-31 NOTE — Telephone Encounter (Signed)
Sent in with new Sig noted.

## 2011-01-03 ENCOUNTER — Ambulatory Visit: Payer: BC Managed Care – PPO | Admitting: Family Medicine

## 2011-01-18 ENCOUNTER — Ambulatory Visit: Payer: BC Managed Care – PPO

## 2011-01-18 ENCOUNTER — Ambulatory Visit: Payer: BC Managed Care – PPO | Admitting: Family Medicine

## 2011-01-18 ENCOUNTER — Other Ambulatory Visit: Payer: Self-pay | Admitting: Obstetrics and Gynecology

## 2011-01-18 DIAGNOSIS — N644 Mastodynia: Secondary | ICD-10-CM

## 2011-01-20 ENCOUNTER — Other Ambulatory Visit: Payer: Self-pay | Admitting: Obstetrics and Gynecology

## 2011-01-20 ENCOUNTER — Ambulatory Visit
Admission: RE | Admit: 2011-01-20 | Discharge: 2011-01-20 | Disposition: A | Discharge status: Home / Self Care | Payer: BC Managed Care – PPO | Source: Ambulatory Visit | Attending: Obstetrics and Gynecology | Admitting: Obstetrics and Gynecology

## 2011-01-20 DIAGNOSIS — N644 Mastodynia: Secondary | ICD-10-CM

## 2011-01-25 ENCOUNTER — Ambulatory Visit (INDEPENDENT_AMBULATORY_CARE_PROVIDER_SITE_OTHER): Payer: BC Managed Care – PPO | Admitting: Family Medicine

## 2011-01-25 ENCOUNTER — Ambulatory Visit: Payer: BC Managed Care – PPO | Admitting: *Deleted

## 2011-01-25 ENCOUNTER — Encounter: Payer: Self-pay | Admitting: Family Medicine

## 2011-01-25 DIAGNOSIS — R928 Other abnormal and inconclusive findings on diagnostic imaging of breast: Secondary | ICD-10-CM

## 2011-01-25 DIAGNOSIS — Z7901 Long term (current) use of anticoagulants: Secondary | ICD-10-CM

## 2011-01-25 DIAGNOSIS — R42 Dizziness and giddiness: Secondary | ICD-10-CM

## 2011-01-25 DIAGNOSIS — I82409 Acute embolism and thrombosis of unspecified deep veins of unspecified lower extremity: Secondary | ICD-10-CM

## 2011-01-25 MED ORDER — MECLIZINE HCL 25 MG PO TABS: 25.0000 mg | ORAL_TABLET | Freq: Two times a day (BID) | ORAL | Status: DC

## 2011-01-25 NOTE — Patient Instructions (Signed)
We will get you set up with vestibular therapy.   Take the Meclizine twice a day in AM and PM for a week, then as needed. Call the surgeon about the biopsy. The dose of Aspirin is 81 mg

## 2011-01-25 NOTE — Progress Notes (Signed)
Pt is stopping warfarin. Did not check today. Dewitt Hoes, MLS

## 2011-01-25 NOTE — Progress Notes (Signed)
  Subjective:    Patient ID: Carla Alexander, female    DOB: March 05, 1971, 40 y.o.   MRN: [id number]  HPI 1.  Blood clot:  Occurred New Year's Eve 2012.  Has been on Coumadin since, for 7 months now.  Is asking about stopping her medication.  No further pain, leg edema, redness, trouble breathing.    2.  Dizziness:  Long-time problem for patient.  Has been going on for almost 4 years now.  In past this was attributed to possible anemia vs hypotension from menometrorrhagia, however patient has since had hysterectomy and symptoms persist.  Describe "room spinning around me" when she turns her head.  Some days worse than others.  Has not tried anything for alleviation.  No numbness, paresthesias, limping, falls, loss of balance, trauma.    3. Mammogram:  Has excisional biopsy scheduled for tomorrow as suspicious lesion found on mammogram.  She is wondering if she needs to stop her Coumadin.  Took it today.  Some breast pain on RIght breast where lesion is located.  No nipple discharge or skin retraction    Review of Systems See HPI above for review of systems.      Objective:   Physical Exam Gen:  Alert, cooperative patient who appears stated age in no acute distress.  Vital signs reviewed. Cardiac:  Regular rate and rhythm without murmur auscultated.  Good S1/S2. Pulm:  Clear to auscultation bilaterally with good air movement.  No wheezes or rales noted.   HEENT:  [place]/AT.  EOMI, PERRL.  MMM, tonsils non-erythematous, non-edematous.  External ears WNL, Bilateral TM's normal without retraction, redness or bulging.  Ext:  No clubbing/cyanosis/erythema.  No edema noted bilateral lower extremities.   Neuro:  CN II-XII intact.  Nystagmus noted R > L horizontal gaze, 4 beats, extinguishes.  No sensory or motor deficits noted peripherally.  No gait abnormalities       Assessment & Plan:

## 2011-01-26 ENCOUNTER — Ambulatory Visit
Admission: RE | Admit: 2011-01-26 | Discharge: 2011-01-26 | Disposition: A | Discharge status: Home / Self Care | Payer: BC Managed Care – PPO | Source: Ambulatory Visit | Attending: Obstetrics and Gynecology | Admitting: Obstetrics and Gynecology

## 2011-01-26 ENCOUNTER — Other Ambulatory Visit: Payer: Self-pay | Admitting: Obstetrics and Gynecology

## 2011-01-26 ENCOUNTER — Encounter: Payer: Self-pay | Admitting: Family Medicine

## 2011-01-26 ENCOUNTER — Other Ambulatory Visit: Payer: Self-pay | Admitting: Diagnostic Radiology

## 2011-01-26 DIAGNOSIS — N644 Mastodynia: Secondary | ICD-10-CM

## 2011-01-26 DIAGNOSIS — R928 Other abnormal and inconclusive findings on diagnostic imaging of breast: Secondary | ICD-10-CM | POA: Insufficient documentation

## 2011-01-26 DIAGNOSIS — R42 Dizziness and giddiness: Secondary | ICD-10-CM | POA: Insufficient documentation

## 2011-01-26 NOTE — Assessment & Plan Note (Signed)
Long discussion about risks and benefits of stopping coumadin.  In this patient with unprovoked DVT, plan to stop coumadin due to patient request.  Risks discussed not limited to edema, swelling, embolism of clot to lung causing PE.  She would like to continue with ASA 81 mg as prophylaxis.  If DVT returns, will need life-long anticoagulation.

## 2011-01-26 NOTE — Assessment & Plan Note (Signed)
Meclizine Vestibular rehab referral.   To return in 1 month or so for followup to see how she's improving

## 2011-01-26 NOTE — Assessment & Plan Note (Signed)
Recommended patient call surgeon's office immediately and inform them of Coumadin.  May need to reschedule, but will leave this to surgeon's preference.  She expressed understanding.

## 2011-01-27 ENCOUNTER — Ambulatory Visit
Admission: RE | Admit: 2011-01-27 | Discharge: 2011-01-27 | Disposition: A | Discharge status: Home / Self Care | Payer: BC Managed Care – PPO | Source: Ambulatory Visit | Attending: Obstetrics and Gynecology | Admitting: Obstetrics and Gynecology

## 2011-01-27 DIAGNOSIS — N644 Mastodynia: Secondary | ICD-10-CM

## 2011-02-02 ENCOUNTER — Telehealth: Payer: Self-pay | Admitting: Family Medicine

## 2011-02-02 NOTE — Telephone Encounter (Signed)
Is supposed to go to PT for her vertigo and hasn't heard anything -pls advise

## 2011-02-04 ENCOUNTER — Other Ambulatory Visit: Payer: Self-pay | Admitting: Family Medicine

## 2011-02-04 DIAGNOSIS — R42 Dizziness and giddiness: Secondary | ICD-10-CM

## 2011-02-04 NOTE — Telephone Encounter (Signed)
ORder placed.

## 2011-02-07 ENCOUNTER — Encounter: Payer: BC Managed Care – PPO | Admitting: Obstetrics and Gynecology

## 2011-03-01 ENCOUNTER — Other Ambulatory Visit: Payer: Self-pay | Admitting: Family Medicine

## 2011-03-01 NOTE — Telephone Encounter (Signed)
Refill request

## 2011-03-10 ENCOUNTER — Other Ambulatory Visit (HOSPITAL_COMMUNITY)
Admission: RE | Admit: 2011-03-10 | Discharge: 2011-03-10 | Disposition: A | Discharge status: Home / Self Care | Payer: BC Managed Care – PPO | Source: Ambulatory Visit | Attending: Obstetrics and Gynecology | Admitting: Obstetrics and Gynecology

## 2011-03-10 ENCOUNTER — Encounter: Payer: Self-pay | Admitting: Obstetrics and Gynecology

## 2011-03-10 ENCOUNTER — Ambulatory Visit (INDEPENDENT_AMBULATORY_CARE_PROVIDER_SITE_OTHER): Payer: BC Managed Care – PPO | Admitting: Obstetrics and Gynecology

## 2011-03-10 VITALS — BP 116/74 | Ht 68.5 in | Wt 272.0 lb

## 2011-03-10 DIAGNOSIS — Z01419 Encounter for gynecological examination (general) (routine) without abnormal findings: Secondary | ICD-10-CM

## 2011-03-10 DIAGNOSIS — D219 Benign neoplasm of connective and other soft tissue, unspecified: Secondary | ICD-10-CM | POA: Insufficient documentation

## 2011-03-10 DIAGNOSIS — M549 Dorsalgia, unspecified: Secondary | ICD-10-CM

## 2011-03-10 DIAGNOSIS — R82998 Other abnormal findings in urine: Secondary | ICD-10-CM

## 2011-03-10 NOTE — Progress Notes (Signed)
The patient came back today for an annual GYN exam. She an abnormal mammogram this Summer and had a have a breast biopsy. The biopsy was benign. She's done well since her LAVH in 2010. She did however have a DVT in her left leg this year. There is no obvious etiology of it. She is now off of her Coumadin. She's also having back pain radiating to her left lower quadrant. She said similar to what she had before I did her LAVH. When she had her LAVH the findings were fibroids and endometriosis.  HEENT: Within normal limits. Neck: No masses. Supraclavicular lymph nodes: Not enlarged. Breasts: Examined in both sitting and lying position. Symmetrical without skin changes or masses. Abdomen: Soft no masses guarding or rebound. No hernias. Pelvic: External within normal limits. BUS within normal limits. Vaginal examination shows good estrogen effect, no cystocele enterocele or rectocele. Cervix and uterus absent. Adnexa within normal limits. Rectovaginal confirmatory. Extremities within normal limits.   Assessment: Back and lower abdominal discomfort. DVT. Sciatica.  Plan: We'll order an ultrasound to rule out recurrent endometriosis or ovarian neoplasm. I told her she needs to have a workup to see if there was a specific cause of her DVT. I also told her that I think she needs to see a neurologist for a better diagnosis of her sciatica.

## 2011-03-10 NOTE — Progress Notes (Signed)
Addended byCammie Mcgee T on: 03/10/2011 04:15 PM   Modules accepted: Orders

## 2011-03-11 ENCOUNTER — Telehealth: Payer: Self-pay | Admitting: Family Medicine

## 2011-03-11 NOTE — Telephone Encounter (Signed)
Pt has been waiting for a long time for a referral and has not heard anything - this goes back to several referrals - one for DVT and PT and [place].  She has not heard anything and is very upset - she would like to speak to someone about this today.

## 2011-03-11 NOTE — Telephone Encounter (Signed)
Will fwd. To [facility] ([place]) .Carla Alexander

## 2011-03-14 ENCOUNTER — Telehealth: Payer: Self-pay

## 2011-03-14 DIAGNOSIS — B373 Candidiasis of vulva and vagina: Secondary | ICD-10-CM

## 2011-03-14 MED ORDER — FLUCONAZOLE 150 MG PO TABS: 150.0000 mg | ORAL_TABLET | Freq: Every day | ORAL | Status: AC

## 2011-03-14 NOTE — Telephone Encounter (Signed)
PT. NOTIFIED OF DR. G'S NOTE BELOW AND SENT RX TO HER PHARMACY.

## 2011-03-14 NOTE — Telephone Encounter (Signed)
Informed pt of appt on 9.11.2012 @ 9 am with [facility] neuro rehab [address]. Pt instructed to call [phone] if she cannot keep appt pt agreed and understood.Laureen Ochs, Viann Shove

## 2011-03-14 NOTE — Telephone Encounter (Signed)
Message copied by Venora Maples on Mon Mar 14, 2011  3:39 PM ------      Message from: Trellis Paganini      Created: Mon Mar 14, 2011  2:07 PM       Patient has yeast on her Pap smear. Please treat with Diflucan 150 mg daily for 2 days.

## 2011-03-15 ENCOUNTER — Ambulatory Visit: Payer: BC Managed Care – PPO | Attending: Family Medicine | Admitting: *Deleted

## 2011-03-15 DIAGNOSIS — R42 Dizziness and giddiness: Secondary | ICD-10-CM | POA: Insufficient documentation

## 2011-03-15 DIAGNOSIS — IMO0001 Reserved for inherently not codable concepts without codable children: Secondary | ICD-10-CM | POA: Insufficient documentation

## 2011-03-17 ENCOUNTER — Ambulatory Visit: Payer: BC Managed Care – PPO | Admitting: Obstetrics and Gynecology

## 2011-03-17 ENCOUNTER — Inpatient Hospital Stay: Admission: RE | Admit: 2011-03-17 | Payer: BC Managed Care – PPO | Source: Ambulatory Visit

## 2011-03-21 ENCOUNTER — Ambulatory Visit: Payer: BC Managed Care – PPO | Admitting: Physical Therapy

## 2011-03-28 ENCOUNTER — Ambulatory Visit: Payer: BC Managed Care – PPO | Admitting: Physical Therapy

## 2011-04-08 LAB — URINALYSIS, ROUTINE W REFLEX MICROSCOPIC
Bilirubin Urine: NEGATIVE
Hgb urine dipstick: NEGATIVE
Ketones, ur: NEGATIVE mg/dL
Nitrite: NEGATIVE
Urobilinogen, UA: 0.2 mg/dL (ref 0.0–1.0)
pH: 6 (ref 5.0–8.0)

## 2011-04-08 LAB — POCT I-STAT, CHEM 8
Calcium, Ion: 1.22 mmol/L (ref 1.12–1.32)
Chloride: 105 mEq/L (ref 96–112)
Creatinine, Ser: 0.8 mg/dL (ref 0.4–1.2)
Glucose, Bld: 87 mg/dL (ref 70–99)
HCT: 36 % (ref 36.0–46.0)

## 2011-04-08 LAB — PREGNANCY, URINE: Preg Test, Ur: NEGATIVE

## 2011-09-08 ENCOUNTER — Ambulatory Visit (INDEPENDENT_AMBULATORY_CARE_PROVIDER_SITE_OTHER): Payer: BC Managed Care – PPO | Admitting: Family Medicine

## 2011-09-08 ENCOUNTER — Other Ambulatory Visit: Payer: Self-pay

## 2011-09-08 ENCOUNTER — Ambulatory Visit: Payer: BC Managed Care – PPO

## 2011-09-08 ENCOUNTER — Ambulatory Visit (HOSPITAL_COMMUNITY)
Admission: RE | Admit: 2011-09-08 | Discharge: 2011-09-08 | Disposition: A | Discharge status: Home / Self Care | Payer: BC Managed Care – PPO | Source: Ambulatory Visit | Attending: Family Medicine | Admitting: Family Medicine

## 2011-09-08 ENCOUNTER — Encounter: Payer: Self-pay | Admitting: Family Medicine

## 2011-09-08 VITALS — BP 122/80 | HR 75 | Temp 98.0°F | Ht 68.5 in | Wt 250.0 lb

## 2011-09-08 DIAGNOSIS — R202 Paresthesia of skin: Secondary | ICD-10-CM

## 2011-09-08 DIAGNOSIS — D649 Anemia, unspecified: Secondary | ICD-10-CM

## 2011-09-08 DIAGNOSIS — R0789 Other chest pain: Secondary | ICD-10-CM

## 2011-09-08 DIAGNOSIS — I82409 Acute embolism and thrombosis of unspecified deep veins of unspecified lower extremity: Secondary | ICD-10-CM

## 2011-09-08 DIAGNOSIS — R42 Dizziness and giddiness: Secondary | ICD-10-CM

## 2011-09-08 DIAGNOSIS — R0602 Shortness of breath: Secondary | ICD-10-CM

## 2011-09-08 DIAGNOSIS — I498 Other specified cardiac arrhythmias: Secondary | ICD-10-CM | POA: Insufficient documentation

## 2011-09-08 DIAGNOSIS — R209 Unspecified disturbances of skin sensation: Secondary | ICD-10-CM

## 2011-09-08 DIAGNOSIS — R9431 Abnormal electrocardiogram [ECG] [EKG]: Secondary | ICD-10-CM | POA: Insufficient documentation

## 2011-09-08 LAB — CBC
HCT: 38.4 % (ref 36.0–46.0)
Hemoglobin: 11.9 g/dL — ABNORMAL LOW (ref 12.0–15.0)
MCH: 25.4 pg — ABNORMAL LOW (ref 26.0–34.0)
MCHC: 31 g/dL (ref 30.0–36.0)
RDW: 14.9 % (ref 11.5–15.5)

## 2011-09-08 LAB — POCT GLYCOSYLATED HEMOGLOBIN (HGB A1C): Hemoglobin A1C: 6.1

## 2011-09-08 MED ORDER — OMEPRAZOLE 40 MG PO CPDR: 40.0000 mg | DELAYED_RELEASE_CAPSULE | Freq: Every day | ORAL | Status: DC

## 2011-09-08 NOTE — Progress Notes (Signed)
Subjective:    Patient ID: Carla Alexander, female    DOB: 05-May-1971, 41 y.o.   MRN: [id number]  HPI 84 F with history of unprovoked DVT in early 2012 no longer on Coumadin, history of recurrent dizziness presenting with intermittent SOB, Chest discomfort, dizziness for 5 days as well as some leg discomfort for 2-3 weeks.   1.  Leg Discomfort Left Leg-History of DVT New Year's Eve 2012 with coumadin discontinued after 6-9 months therapy. Patient has had some recurrent problems with leg discomfort and paresthesias since DVT. Today, she reports some throbbing in her thigh and calf intermittently for 2-3 weeks accompanied by cycling worsening of her recurrent paresthesias in left foot. Patient concerned about DVT. Not smoker or taking OCP (nor has she ever).   2.  Dizziness:  Long-time problem for patient for over 4 years. Previously used meclizine and neurorehab for vestibular exercises which she says did not help. She says in last 5 days she has had another "wave" of the dizziness associated with problems 3 and 4. She says it is mainly lightheadedness and it can come on at any time up to several times per day. Not always in sam esituation and not always with standing although occasionally does happen. Still has "room spinning around me" when she turns her head. No  limping, falls, loss of balance, trauma.  Only paresthesias left foot.   3. SOB-says for 5 days she has had SOB lasting several minutes up to 3-4x pe day. On one evening, she was doing a workout DVD for 40 minutes, 10 minutes after stopping she describes "heart feeling like it is beating fast" and heavy breathing. SOB not necessarily exertional, sometimes happens completely at rest. No orthopnea or PND. Several times she has SOB without chest discomfort. While in room, patient notes that she can see her chest making bigger movements than normal and she does not like that. Wants to make sure her heart and lungs are ok.   4. Chest  discomfort-describes 3x daily getting a tight feeling substernally lasting 15 minutes then completely resolving. Sometimes coughing or moving resolves pain. Also has had this discomfort occasionally after eating including after eating some cheese last night and a few minutes later having some discomfort along with SOB. Once again, discomfort not always linked to SOB. No other symptoms like nausea, vomitign, diaphoresis. No recent lipids or a1c. No early family history of MI (soonest was grandfather in mid 36s). Not a smoker.   Review of Systems negative except as noted in HPI       Objective:   Physical Exam  Constitutional: She is oriented to person, place, and time. She appears well-developed and well-nourished. No distress.  HENT:  Head: Normocephalic and atraumatic.  Mouth/Throat: No oropharyngeal exudate.  Eyes: Conjunctivae and EOM are normal. Pupils are equal, round, and reactive to light.  Neck: Normal range of motion. Neck supple.  Cardiovascular: Normal rate and regular rhythm.  Exam reveals no gallop and no friction rub.   No murmur heard. Pulmonary/Chest: Effort normal and breath sounds normal. No respiratory distress. She has no wheezes. She has no rales. She exhibits no tenderness.  Abdominal: Soft. Bowel sounds are normal. She exhibits no distension. There is no tenderness. There is no rebound.  Musculoskeletal: Normal range of motion. She exhibits edema (trace bilateral. Legs symmetric bilaterally. no cords felt. ).  Neurological: She is alert and oriented to person, place, and time. No cranial nerve deficit. She exhibits normal muscle tone.  Skin: Skin is warm and dry.          Assessment & Plan:

## 2011-09-08 NOTE — Assessment & Plan Note (Signed)
Patient with history of this but seems more prevalent in recent days and associated with left leg throbbing. So will get venous duplex of lower extremities.

## 2011-09-08 NOTE — Patient Instructions (Signed)
Dear Mrs. Carla Alexander,   It was great to see you today. Thank you for coming to clinic. Please read below regarding the issues that we discussed.   1. I would like for you to go to the front of  and ask the receptionist for directions for your lower extremity venous duplex (scan of your legs to rule out a blood clot). 2. Since you are having some chest discomfort and palpitations, we did an EKG which looked normal. You are low risk for a heart attack but we are going to do some other labs related to your risk. Since this is often related to food, I want you to try a medication for reflux for the next 4 weeks and come back to clinic with 2-4 weeks to let us know if that is helping.  3. We will check a blood level for your hemoglobin since you have a history of anemia.   Please follow up in clinic in 2-4 weeks . Please call earlier if you have any questions or concerns. Also, please call us if you are having persistent chest pain or shortness of breath.   Sincerely,  Dr. Tana Conch

## 2011-09-08 NOTE — Assessment & Plan Note (Signed)
Long term history of dizziness/vertigo. Unchanged from previous but patient concerned in light of other symptoms. No acute interventions or investigation today. F/u PCP.

## 2011-09-08 NOTE — Assessment & Plan Note (Addendum)
EKG showed sinus bradycardia (previous HR in office noted in 70s). No st/t wave changes (some flattening of t waves in v1 and III unchanged from 2008 EKG). Will try trial of PPI and reassess in 2 weeks. Low risk for MI. Patient has not had TSH, LDL, a1c in last year so will recheck. Patient a1c at 6.1 so still at risk for diabetes-other lab pending. Patient also says she had similar discomfort with last DVT so will get venous dopplers. Red flags reviewed with patient-some noted on AVS

## 2011-09-08 NOTE — Assessment & Plan Note (Addendum)
Due to history of DVT, will get venous duplex bilaterally today. No physical exam findings consistent with DVT. Could consider coagulation disorders and work up.

## 2011-09-08 NOTE — Assessment & Plan Note (Signed)
Intermittent heavy breathing per patient. Could be anxiety related-patient very concerned given history of DVT and could be contributing. Not exertional. Does not appear to be MI (low risk and atypical history) or heart failure related (given exam). Will see if intervention for chest discomfort improves perceived SOB.

## 2011-09-08 NOTE — Assessment & Plan Note (Signed)
Patient concerned about anemia with her dizziness so will recheck CBC today.

## 2011-09-09 ENCOUNTER — Ambulatory Visit (HOSPITAL_COMMUNITY)
Admission: RE | Admit: 2011-09-09 | Discharge: 2011-09-09 | Disposition: A | Discharge status: Home / Self Care | Payer: BC Managed Care – PPO | Source: Ambulatory Visit | Attending: Family Medicine | Admitting: Family Medicine

## 2011-09-09 DIAGNOSIS — M79609 Pain in unspecified limb: Secondary | ICD-10-CM | POA: Insufficient documentation

## 2011-09-09 DIAGNOSIS — I82409 Acute embolism and thrombosis of unspecified deep veins of unspecified lower extremity: Secondary | ICD-10-CM | POA: Insufficient documentation

## 2011-09-09 NOTE — Progress Notes (Signed)
*  PRELIMINARY RESULTS* Vascular Ultrasound Bilateral lower extremity venous duplex  has been completed.  Preliminary findings: Right lower extremity: no evidence of DVT or SVT. Left lower extremity: Positive for chronic DVT of the distal FV. Appears nonocclusive. No evidence of SVT. Bilaterally no Baker's cyst noted. Bilateral CFV: sluggish flow noted.   Krisanne Lich, Real Cons 09/09/2011, 10:59 AM

## 2011-09-12 ENCOUNTER — Other Ambulatory Visit: Payer: Self-pay | Admitting: Family Medicine

## 2011-09-12 ENCOUNTER — Emergency Department (HOSPITAL_COMMUNITY): Payer: BC Managed Care – PPO

## 2011-09-12 ENCOUNTER — Encounter (HOSPITAL_COMMUNITY): Payer: Self-pay | Admitting: *Deleted

## 2011-09-12 ENCOUNTER — Emergency Department (HOSPITAL_COMMUNITY)
Admission: EM | Admit: 2011-09-12 | Discharge: 2011-09-12 | Disposition: A | Discharge status: Home / Self Care | Payer: BC Managed Care – PPO | Attending: Emergency Medicine | Admitting: Emergency Medicine

## 2011-09-12 ENCOUNTER — Other Ambulatory Visit: Payer: Self-pay

## 2011-09-12 DIAGNOSIS — R079 Chest pain, unspecified: Secondary | ICD-10-CM | POA: Insufficient documentation

## 2011-09-12 DIAGNOSIS — I82409 Acute embolism and thrombosis of unspecified deep veins of unspecified lower extremity: Secondary | ICD-10-CM | POA: Insufficient documentation

## 2011-09-12 DIAGNOSIS — R0602 Shortness of breath: Secondary | ICD-10-CM | POA: Insufficient documentation

## 2011-09-12 DIAGNOSIS — R059 Cough, unspecified: Secondary | ICD-10-CM | POA: Insufficient documentation

## 2011-09-12 DIAGNOSIS — R05 Cough: Secondary | ICD-10-CM | POA: Insufficient documentation

## 2011-09-12 DIAGNOSIS — I82509 Chronic embolism and thrombosis of unspecified deep veins of unspecified lower extremity: Secondary | ICD-10-CM

## 2011-09-12 LAB — DIFFERENTIAL
Basophils Relative: 0 % (ref 0–1)
Eosinophils Absolute: 0 10*3/uL (ref 0.0–0.7)
Eosinophils Relative: 1 % (ref 0–5)
Lymphs Abs: 2.7 10*3/uL (ref 0.7–4.0)
Monocytes Absolute: 0.4 10*3/uL (ref 0.1–1.0)
Monocytes Relative: 6 % (ref 3–12)
Neutrophils Relative %: 50 % (ref 43–77)

## 2011-09-12 LAB — POCT I-STAT, CHEM 8
Calcium, Ion: 1.22 mmol/L (ref 1.12–1.32)
Creatinine, Ser: 0.9 mg/dL (ref 0.50–1.10)
Glucose, Bld: 90 mg/dL (ref 70–99)
HCT: 38 % (ref 36.0–46.0)
Hemoglobin: 12.9 g/dL (ref 12.0–15.0)
Potassium: 3.8 mEq/L (ref 3.5–5.1)
TCO2: 21 mmol/L (ref 0–100)

## 2011-09-12 LAB — CBC
HCT: 35 % — ABNORMAL LOW (ref 36.0–46.0)
Hemoglobin: 11.6 g/dL — ABNORMAL LOW (ref 12.0–15.0)
MCH: 26.1 pg (ref 26.0–34.0)
MCHC: 33.1 g/dL (ref 30.0–36.0)
MCV: 78.8 fL (ref 78.0–100.0)
RBC: 4.44 MIL/uL (ref 3.87–5.11)

## 2011-09-12 MED ORDER — RIVAROXABAN 15 MG PO TABS
15.0000 mg | ORAL_TABLET | Freq: Once | ORAL | Status: AC
  Administered 2011-09-12: 15 mg via ORAL
  Filled 2011-09-12: qty 1

## 2011-09-12 MED ORDER — RIVAROXABAN 15 MG PO TABS: 15.0000 mg | ORAL_TABLET | Freq: Two times a day (BID) | ORAL | Status: DC

## 2011-09-12 MED ORDER — IOHEXOL 300 MG/ML  SOLN
90.0000 mL | Freq: Once | INTRAMUSCULAR | Status: AC | PRN
  Administered 2011-09-12: 90 mL via INTRAVENOUS

## 2011-09-12 NOTE — ED Notes (Signed)
Per ems pt is from [facility]. Pt called ems due to Shortness of breath, sudden onset. Denied chest pain during transport. Pt had a hx of DVT in 2011. Reports pain in left leg for past few weeks. Pt also has sciatica in the left leg, pain exacerbated by exercise. Pt alert and oriented x4. Ambulatory.

## 2011-09-12 NOTE — Progress Notes (Signed)
Called patient and updated her on chronic DVT noted in LLE and need for chronic anticoagulation. Patient initially resistant to taking medication without meeting with Dr. Gwendolyn Grant but when informed symptoms with SOB/chest discomfort could possibly be related, patient agreeable to starting medication tonight. Patient uncertain of long term use and wants to discuss with Dr. Gwendolyn Grant. Reviewed other unremarkable labs with patient and informed that LDL is at goal of <130 given no known risk factors. Also informed patient needs to continue therapeutic lifestyle changes given a1c 6.1.   Discussed case with Dr. Gwendolyn Grant who will see patient in clinic tomorrow. Patient has not had BMET recently so discussed need for therapy. Patient given rx for xarelto 15 mg BID for 3 weeks and will need new rx for 20mg  after 3 weeks.   Tana Conch, MD, PGY1 09/12/2011 3:31 PM

## 2011-09-12 NOTE — ED Notes (Signed)
Patient transported to CT and returned 

## 2011-09-12 NOTE — ED Provider Notes (Signed)
Carla Alexander is a 41 y.o. female patient has no further complaints. Her chest pain is gone now. Repeat vitals are stable.  Nonspecific chest pain with negative CT angiogram of the chest. Chronic leg. DVT, treatment, started in ED.  Plan followup with PCP as scheduled.  Flint Melter, MD 09/12/11 2219

## 2011-09-12 NOTE — ED Provider Notes (Signed)
History     CSN: [id number]  Arrival date & time 09/12/11  1619   First MD Initiated Contact with Patient 09/12/11 1712      Chief Complaint  Patient presents with  . Shortness of Breath    (Consider location/radiation/quality/duration/timing/severity/associated sxs/prior treatment) HPI  Patient who states she was dx with DVT about a "year or two ago" and was on lovenox at that time but states that she takes no meds on regular basis and is followed by [facility] as PCP presents to ER with concern of ongoing DVT of LLE as well as a one and a half week long hx of gradual onset lightheadedness, CP, SOB and left lower leg pain. Patient states that she went to see [facility] because of aforementioned symptoms and had labs and a vascular US performed. Patient states she was called by PCP today to be told that she had ongoing DVT of LLE and called in prescription of xarelto. Patient states she did not pick up prescription because she was coming to "ER to be checked up" because ongoing lightheadedness and intermittent chest tightness and SOB. Patient states that CP is a tightness in chest and has been sporadic over the last 1.5 weeks. Patient states that she has noticed about every other day some chest discomfort with multiple episodes a day lasting about of tightness. Patient denies aggravating or alleviating factors to chest tightness. She is also complaining of left lower leg pain but states "I have sciatica in that leg and always have some pain." she denies skin changes, swelling, erythema or numbness/tingling/weakness. Patient states mild constant SOB since onset about a week and a half ago. Denies fevers, chills, cough, hemoptysis, difficulty amullating, loss of coordination, pleuritic CP.   Past Medical History  Diagnosis Date  . DVT (deep venous thrombosis)   . Fibroid   . Anxiety     Past Surgical History  Procedure Date  . Tubal ligation   . Vaginal hysterectomy 2010    [facility]    Family  History  Problem Relation Age of Onset  . Heart disease Mother   . Diabetes Mother   . Stroke Father   . Diabetes Sister   . Heart disease Sister   . Cancer Maternal Grandfather     Colon  . Heart disease Maternal Grandfather   . Diabetes Maternal Grandfather     History  Substance Use Topics  . Smoking status: Never Smoker   . Smokeless tobacco: Never Used  . Alcohol Use: No    OB History    Grav Para Term Preterm Abortions TAB SAB Ect Mult Living   2 2 2       2       Review of Systems  All other systems reviewed and are negative.    Allergies  Review of patient's allergies indicates no known allergies.  Home Medications   Current Outpatient Rx  Name Route Sig Dispense Refill  . ASPIRIN 81 MG PO TABS Oral Take 81 mg by mouth daily.      Marland Kitchen RIVAROXABAN 15 MG PO TABS Oral Take 1 tablet (15 mg total) by mouth 2 (two) times daily. Please stop taking aspirin while taking this medication. Thanks, Dr. Durene Cal 42 tablet 0    BP 138/92  Pulse 52  Temp(Src) 98.1 F (36.7 C) (Oral)  Resp 20  SpO2 100%  LMP 11/01/2008  Physical Exam  Nursing note and vitals reviewed. Constitutional: She is oriented to person, place, and time. She appears  well-developed and well-nourished. No distress.  HENT:  Head: Normocephalic and atraumatic.  Eyes: Conjunctivae and EOM are normal. Pupils are equal, round, and reactive to light. Right eye exhibits no nystagmus. Left eye exhibits no nystagmus.  Neck: Normal range of motion. Neck supple.  Cardiovascular: Normal rate, regular rhythm, normal heart sounds and intact distal pulses.  Exam reveals no gallop and no friction rub.   No murmur heard. Pulmonary/Chest: Effort normal and breath sounds normal. No respiratory distress. She has no wheezes. She has no rales. She exhibits no tenderness.  Abdominal: Soft. Bowel sounds are normal. She exhibits no distension and no mass. There is no tenderness. There is no rebound and no guarding.    Musculoskeletal: Normal range of motion. She exhibits tenderness. She exhibits no edema.       Mild TTP of left popliteal fossa but no skin changes or edema. Good pedal pulses bilaterally.   Neurological: She is alert and oriented to person, place, and time. She has normal reflexes. No cranial nerve deficit. Coordination normal.  Skin: Skin is warm and dry. No rash noted. She is not diaphoretic. No erythema.  Psychiatric: She has a normal mood and affect.    ED Course  Procedures (including critical care time)  I spoke with Dr. Darrick Penna who states that though low likelihood of embolus from chronic DVT who would suggest CT angio if complaints of SOB or Cp.   Patient's pulse ox has remained 100% throughout ER stay with normal HR. Patient denies any CP or SOB currently.   Labs Reviewed  CBC - Abnormal; Notable for the following:    Hemoglobin 11.6 (*)    HCT 35.0 (*)    All other components within normal limits  DIFFERENTIAL  POCT I-STAT, CHEM 8   Dg Chest 2 View  09/12/2011  *RADIOLOGY REPORT*  Clinical Data: Cough, shortness of breath  CHEST - 2 VIEW  Comparison: 01/08/2010  Findings: Cardiomediastinal silhouette is stable.  No acute infiltrate or pleural effusion.  No pulmonary edema.  Bony thorax is unremarkable.  IMPRESSION: No active disease.  Original Report Authenticated By: Natasha Mead, M.D.     No diagnosis found.    MDM  Dr. Effie Shy to follow pending CT angio of chest. VSS        Jenness Corner, PA 09/12/11 2033

## 2011-09-12 NOTE — Discharge Instructions (Signed)
See her doctor as scheduled. Start the Xarelto  prescription tomorrow  Chest Pain (Nonspecific) It is often hard to give a specific diagnosis for the cause of chest pain. There is always a chance that your pain could be related to something serious, such as a heart attack or a blood clot in the lungs. You need to follow up with your caregiver for further evaluation. CAUSES   Heartburn.   Pneumonia or bronchitis.   Anxiety or stress.   Inflammation around your heart (pericarditis) or lung (pleuritis or pleurisy).   A blood clot in the lung.   A collapsed lung (pneumothorax). It can develop suddenly on its own (spontaneous pneumothorax) or from injury (trauma) to the chest.   Shingles infection (herpes zoster virus).  The chest wall is composed of bones, muscles, and cartilage. Any of these can be the source of the pain.  The bones can be bruised by injury.   The muscles or cartilage can be strained by coughing or overwork.   The cartilage can be affected by inflammation and become sore (costochondritis).  DIAGNOSIS  Lab tests or other studies, such as X-rays, electrocardiography, stress testing, or cardiac imaging, may be needed to find the cause of your pain.  TREATMENT   Treatment depends on what may be causing your chest pain. Treatment may include:   Acid blockers for heartburn.   Anti-inflammatory medicine.   Pain medicine for inflammatory conditions.   Antibiotics if an infection is present.   You may be advised to change lifestyle habits. This includes stopping smoking and avoiding alcohol, caffeine, and chocolate.   You may be advised to keep your head raised (elevated) when sleeping. This reduces the chance of acid going backward from your stomach into your esophagus.   Most of the time, nonspecific chest pain will improve within 2 to 3 days with rest and mild pain medicine.  HOME CARE INSTRUCTIONS   If antibiotics were prescribed, take your antibiotics as  directed. Finish them even if you start to feel better.   For the next few days, avoid physical activities that bring on chest pain. Continue physical activities as directed.   Do not smoke.   Avoid drinking alcohol.   Only take over-the-counter or prescription medicine for pain, discomfort, or fever as directed by your caregiver.   Follow your caregiver's suggestions for further testing if your chest pain does not go away.   Keep any follow-up appointments you made. If you do not go to an appointment, you could develop lasting (chronic) problems with pain. If there is any problem keeping an appointment, you must call to reschedule.  SEEK MEDICAL CARE IF:   You think you are having problems from the medicine you are taking. Read your medicine instructions carefully.   Your chest pain does not go away, even after treatment.   You develop a rash with blisters on your chest.  SEEK IMMEDIATE MEDICAL CARE IF:   You have increased chest pain or pain that spreads to your arm, neck, jaw, back, or abdomen.   You develop shortness of breath, an increasing cough, or you are coughing up blood.   You have severe back or abdominal pain, feel nauseous, or vomit.   You develop severe weakness, fainting, or chills.   You have a fever.  THIS IS AN EMERGENCY. Do not wait to see if the pain will go away. Get medical help at once. Call your local emergency services (911 in [place] Do not drive yourself  to the hospital. MAKE SURE YOU:   Understand these instructions.   Will watch your condition.   Will get help right away if you are not doing well or get worse.  Document Released: 03/30/2005 Document Revised: 06/09/2011 Document Reviewed: 01/24/2008 [facility] Patient Information 2012 [place], [place].Deep Vein Thrombosis A deep vein thrombosis (DVT) is a blood clot (thrombus) that develops in a deep vein. A DVT is a clot in the deep, larger veins of the leg, arm, or pelvis. These are more dangerous  than clots that might form in veins on the surface of the body. Deep vein thrombosis can lead to complications if the clot breaks off and travels in the bloodstream to the lungs. CAUSES Blood clots form in a vein for different reasons. Usually several things cause blood clots. They include:  The flow of blood slows down.   The inside of the vein is damaged in some way.   The person has a condition that makes blood clot more easily. These conditions may include:   Older age (especially over 85 years old).   Having a history of blood clots.   Having major or lengthy surgery. Hip surgery is particularly high-risk.   Breaking a hip or leg.   Sitting or lying still for a long time.   Cancer or cancer treatment.   Having a long, thin tube (catheter) placed inside a vein during a medical procedure.   Being overweight (obese).   Pregnancy and childbirth.   Medicines with estrogen.   Smoking.   Other circulation or heart problems.  SYMPTOMS When a clot forms, it can either partially or totally block the blood flow in that vein. Symptoms of a DVT can include:  Swelling of the leg or arm, especially if one side is much worse.   Warmth and redness of the leg or arm, especially if one side is much worse.   Pain in an arm or leg. If the clot is in the leg, symptoms may be more noticeable or worse when standing or walking.  If the blood clot travels to the lung, it may cause:  Shortness of breath.   Chest pain. The pain may be worsened by deep breaths.   Coughing up thick mucus (phlegm), possibly flecked with blood.  Anyone with these symptoms should get emergency medical treatment right away. Call your local emergency services (911 in [place] if you have these symptoms. DIAGNOSIS If a DVT is suspected, your caregiver will take a full medical history. He or she will also perform a physical exam. Tests that also may be required include:  Studies of the clotting properties of the  blood.   An ultrasound scan.   X-rays to show the flow of blood when special dye is injected into the veins (venography).   Studies of your lungs if you have any chest symptoms.  PREVENTION  Exercise the legs regularly. Take a brisk 30 minute walk every day.   Maintain a weight that is appropriate for your height.   Avoid sitting or lying in bed for long periods of time without moving your legs.   Women, particularly those over the age of 29, should consider the risks and benefits of taking estrogen medicines, including birth control pills.   Do not smoke, especially if you take estrogen medicines.   Long-distance travel can increase your risk. You should exercise your legs by walking or pumping the muscles every hour.   In hospital prevention:   Prevention may include medical and  nonmedical measures.  TREATMENT  The most common treatment for DVT is blood thinning (anticoagulant) medicine, which reduces the blood's tendency to clot. Anticoagulants can stop new blood clots from forming and old ones from growing. They cannot dissolve existing clots. Your body does this by itself over time. Anticoagulants can be given by mouth, by intravenous (IV) access, or by injection. Your caregiver will determine the best program for you.   Less commonly, clot-dissolving drugs (thrombolytics) are used to dissolve a DVT. They carry a high risk of bleeding, so they are used mainly in severe cases.   Very rarely, a blood clot in the leg needs to be removed surgically.   If you are unable to take anticoagulants, your caregiver may arrange for you to have a filter placed in a main vein in your belly (abdomen). This filter prevents clots from traveling to your lungs.  HOME CARE INSTRUCTIONS  Take all medicines prescribed by your caregiver. Follow the directions carefully.   You will most likely continue taking anticoagulants after you leave the hospital. Your caregiver will advise you on the length  of treatment (usually 3 to 6 months, sometimes for life).   Taking too much or too little of an anticoagulant is dangerous. While taking this type of medicine, you will need to have regular blood tests to be sure the dose is correct. The dose can change for many reasons. It is critically important that you take this medicine exactly as prescribed, and that you have blood tests exactly as directed.   Many foods can interfere with anticoagulants. These include foods high in vitamin K, such as spinach, kale, broccoli, cabbage, collard and turnip greens, Brussels sprouts, peas, cauliflower, seaweed, parsley, beef and pork liver, green tea, and soybean oil. Your caregiver should discuss limits on these foods with you or you should arrange a visit with a dietician to answer your questions.   Many medicines can interfere with anticoagulants. You must tell your caregiver about any and all medicines you take. This includes all vitamins and supplements. Be especially cautious with aspirin and anti-inflammatory medicines. Ask your caregiver before taking these.   Anticoagulants can have side effects, mostly excessive bruising or bleeding. You will need to hold pressure over cuts for longer than usual. Avoid alcoholic drinks or consume only very small amounts while taking this medicine.   If you are taking an anticoagulant:   Wear a medical alert bracelet.   Notify your dentist or other caregivers before procedures.   Avoid contact sports.   Ask your caregiver how soon you can go back to normal activities. Not being active can lead to new clots. Ask for a list of what you should and should not do.   Exercise your lower leg muscles. This is important while traveling.   You may need to wear compression stockings. These are tight elastic stockings that apply pressure to the lower legs. This can help keep the blood in the legs from clotting.   If you are a smoker, you should quit.   Learn as much as you  can about DVT.  SEEK MEDICAL CARE IF:  You have unusual bruising or any bleeding problems.   The swelling or pain in your affected arm or leg is not gradually improving.   You anticipate surgery or long-distance travel. You should get specific advice on DVT prevention.   You discover other family members with blood clots. This may require further testing for inherited diseases or conditions.  SEEK IMMEDIATE  MEDICAL CARE IF:  You develop chest pain.   You develop severe shortness of breath.   You begin to cough up bloody mucus or phlegm (sputum).   You feel dizzy or faint.   You develop swelling or pain in the leg.   You have breathing problems after traveling.  MAKE SURE YOU:  Understand these instructions.   Will watch your condition.   Will get help right away if you are not doing well or get worse.  Document Released: 06/20/2005 Document Revised: 06/09/2011 Document Reviewed: 08/12/2010 [employer] Patient Information 2012 [place], [place].

## 2011-09-13 ENCOUNTER — Ambulatory Visit (INDEPENDENT_AMBULATORY_CARE_PROVIDER_SITE_OTHER): Payer: BC Managed Care – PPO | Admitting: Family Medicine

## 2011-09-13 ENCOUNTER — Encounter: Payer: Self-pay | Admitting: Family Medicine

## 2011-09-13 DIAGNOSIS — R42 Dizziness and giddiness: Secondary | ICD-10-CM

## 2011-09-13 DIAGNOSIS — I82409 Acute embolism and thrombosis of unspecified deep veins of unspecified lower extremity: Secondary | ICD-10-CM

## 2011-09-13 DIAGNOSIS — H9190 Unspecified hearing loss, unspecified ear: Secondary | ICD-10-CM

## 2011-09-13 DIAGNOSIS — F410 Panic disorder [episodic paroxysmal anxiety] without agoraphobia: Secondary | ICD-10-CM

## 2011-09-13 MED ORDER — LORAZEPAM 0.5 MG PO TABS: 0.5000 mg | ORAL_TABLET | Freq: Three times a day (TID) | ORAL | Status: AC | PRN

## 2011-09-13 NOTE — Assessment & Plan Note (Signed)
Second DVT for her. She is on twice a day dosing of the Xarelto She will be on this for 3 weeks at that point she will need lifelong anticoagulation

## 2011-09-13 NOTE — Assessment & Plan Note (Signed)
Unclear etiology. Patient had normal audiometry here in clinic. She might possibly benefit from referral to ENT

## 2011-09-13 NOTE — Progress Notes (Signed)
  Subjective:    Patient ID: Carla Alexander, female    DOB: Oct 25, 1970, 41 y.o.   MRN: [id number]  HPI  One. Followup for left DVT: Diagnoses DVT last week after her one to 2 months of increasing left lower extremity pain. Please see previous office visit notes for full details. Patient had been increasing exercise regimen and therefore about pain was secondary to soreness from exercise. She is currently doing well. She is on Xarelto. She was having some complaints of chest pain and presented to the emergency department where she had a negative CTA of her chest. She still has some residual pain. She has not required any pain medication for this. No chest pain today. No dyspnea on exertion. No tachypnea or tachycardia.  #2. Panic attacks patient describes episodes that can occur 2-3 times a day were go several days without having any episodes at all. She describes palpitations, chest tightness, difficulty breathing, feelings of doom. They last 30-40 minutes and then spontaneously resolve. She has had these problems for the past 2-3 years.  She currently denies any depression or anxiety.  #3. Vertigo: Long-time problem from this patient. Going back and records she has had at least 3 or 4 years of intermittent vertigo. He does have a normal MRI of her head. She describes vertigo symptoms to 3 times a week. They last for anywhere from 10 minutes to one hour at a time. She has tried vestibular rehabilitation. She is on meclizine. Neither of these have provided relief  The following portions of the patient's history were reviewed and updated as appropriate: allergies, current medications, past medical history, family and social history, and problem list.  Patient is a nonsmoker.    Review of Systems See HPI above for review of systems.       Objective:   Physical Exam Gen:  Alert, cooperative patient who appears stated age in no acute distress.  Vital signs reviewed. HEENT:  [place]/AT.  EOMI, PERRL.   MMM, tonsils non-erythematous, non-edematous.  External ears WNL, Bilateral TM's normal without retraction, redness or bulging.  Neck: No masses or thyromegaly or limitation in range of motion.  No cervical lymphadenopathy. Pulm:  Clear to auscultation bilaterally with good air movement.  No wheezes or rales noted.   Cardiac:  Regular rate and rhythm without murmur auscultated.  Good S1/S2. Abd:  Soft/nondistended/nontender.  Good bowel sounds throughout all four quadrants.  No masses noted.  Ext:  Trace edema ankles bilateral lower extremities. Slightly tender to palpation left posterior calf. Neuro:  Grossly normal, no gait abnormalities Psych:  Not depressed or anxious appearing.  Conversant and engaged        Assessment & Plan:

## 2011-09-13 NOTE — ED Provider Notes (Signed)
Medical screening examination/treatment/procedure(s) were conducted as a shared visit with non-physician practitioner(s) and myself.  I personally evaluated the patient during the encounter  Flint Melter, MD 09/13/11 1140

## 2011-09-13 NOTE — Patient Instructions (Signed)
We will look into the ENT referral for your dizziness.  If you have an episode or your symptoms come back, take the Ativan one pill.  This should resolve those symptoms.  Keep taking the Xarelto twice daily x 3 weeks.  After that you will take 1 pill a day.  Come back and see me in 2 weeks.

## 2011-09-13 NOTE — Assessment & Plan Note (Signed)
Plan to treat with short-term course of Ativan. Followup in 2 weeks for any improvement.

## 2011-09-16 ENCOUNTER — Ambulatory Visit: Payer: BC Managed Care – PPO | Admitting: Family Medicine

## 2011-09-27 ENCOUNTER — Encounter: Payer: Self-pay | Admitting: Family Medicine

## 2011-09-27 ENCOUNTER — Ambulatory Visit (INDEPENDENT_AMBULATORY_CARE_PROVIDER_SITE_OTHER): Payer: BC Managed Care – PPO | Admitting: Family Medicine

## 2011-09-27 VITALS — BP 114/77 | HR 66 | Ht 68.5 in | Wt 255.0 lb

## 2011-09-27 DIAGNOSIS — R42 Dizziness and giddiness: Secondary | ICD-10-CM

## 2011-09-27 DIAGNOSIS — F410 Panic disorder [episodic paroxysmal anxiety] without agoraphobia: Secondary | ICD-10-CM

## 2011-09-27 DIAGNOSIS — I82409 Acute embolism and thrombosis of unspecified deep veins of unspecified lower extremity: Secondary | ICD-10-CM

## 2011-09-27 NOTE — Patient Instructions (Signed)
Keep taking the Xarelto as you have been doing for the next week and then decrease down to 1 a day after that. If you have any more of the palpitations please let me know.   Come back and see me in 4 -6 weeks so I can make sure you're doing okay.

## 2011-09-27 NOTE — Progress Notes (Signed)
  Subjective:    Patient ID: Carla Alexander, female    DOB: [birth year], 41 y.o.   MRN: [id number]  HPI 1.  FU for DVT:  Taking Xarelto for 1 more week, then decreasing to 1 pill a day.  Has noticed increased bruising since being on this medicine, similar to when she was on Coumadin.  Still has some mild persistent edema, worse in Left leg, which is leg with DVT.  No dyspnea.  Has but is not wearing compression stockings.   2.  Panic attacks:  Much improved.  Has not needed/not taken any of the Ativan from last visit.  No further chest pain/palpitations/ feelings of anxiety.  She feels very good that this has not returned.    3.  Vertigo:  Improved.  Did not follow up with ENT, did not have copay.  States she will do this as she would like to know cause of vertigo as this has waxed and waned over the past several years.  This seems to be one of her "remission" phases.    The following portions of the patient's history were reviewed and updated as appropriate: allergies, current medications, past medical history, family and social history, and problem list.  Patient is a nonsmoker.  Review of Systems     Objective:   Physical Exam Gen:  Alert, cooperative patient who appears stated age in no acute distress.  Vital signs reviewed. HEENT:  [place]/AT.  EOMI, PERRL.  MMM, tonsils non-erythematous, non-edematous.  External ears WNL, Bilateral TM's normal without retraction, redness or bulging.  Cardiac:  Regular rate and rhythm without murmur auscultated.  Good S1/S2. Pulm:  Clear to auscultation bilaterally with good air movement.  No wheezes or rales noted.   Ext:  Mild trace edema BL LE's, Right > Left.  Calf with minimal tenderness.  Homan's negative BL.        Assessment & Plan:

## 2011-09-27 NOTE — Assessment & Plan Note (Signed)
Improved.   Ativan if needed.

## 2011-09-27 NOTE — Assessment & Plan Note (Signed)
FU with ENT once she has copay. No further episodes since our last visit, but she has occasionally gone weeks without an attack in past before symptoms return.

## 2011-09-27 NOTE — Assessment & Plan Note (Signed)
Finish BID Xarelto next week. Lifelong daily after that.  FU 4 - 6 weeks to make sure she's doing well on once daily Xarelto or sooner if needed.

## 2011-10-03 ENCOUNTER — Other Ambulatory Visit: Payer: Self-pay | Admitting: Family Medicine

## 2011-10-03 DIAGNOSIS — R197 Diarrhea, unspecified: Secondary | ICD-10-CM

## 2011-10-03 DIAGNOSIS — R071 Chest pain on breathing: Secondary | ICD-10-CM

## 2011-10-28 ENCOUNTER — Encounter: Payer: Self-pay | Admitting: Family Medicine

## 2011-10-28 ENCOUNTER — Ambulatory Visit (INDEPENDENT_AMBULATORY_CARE_PROVIDER_SITE_OTHER): Payer: BC Managed Care – PPO | Admitting: Family Medicine

## 2011-10-28 VITALS — BP 116/80 | HR 65 | Temp 98.6°F | Ht 68.5 in | Wt 250.2 lb

## 2011-10-28 DIAGNOSIS — F410 Panic disorder [episodic paroxysmal anxiety] without agoraphobia: Secondary | ICD-10-CM

## 2011-10-28 DIAGNOSIS — I82409 Acute embolism and thrombosis of unspecified deep veins of unspecified lower extremity: Secondary | ICD-10-CM

## 2011-10-28 MED ORDER — RIVAROXABAN 15 MG PO TABS: 15.0000 mg | ORAL_TABLET | Freq: Every day | ORAL | Status: DC

## 2011-10-28 NOTE — Progress Notes (Signed)
  Subjective:    Patient ID: Carla Alexander, female    DOB: 09/02/1970, 41 y.o.   MRN: [id number]  HPI . 41 yo AAF here for FU after diagnosis of DVT.  On Xarelto daily indefinitely as this is her second unprovoked DVT.  No other concerns or complaints.    1.  Left leg DVT FU:  No further pain.  Does occasionally have swelling in the leg more than other (described as mild) when on her feet most of the day.  Walking for exercise.  Taking Xarelto daily.  Has finished BID course and is now only on daily pill.  No fevers or chills, no tachycardia or dyspnea  Review of Systems See HPI above for review of systems.       Objective:   Physical Exam Gen:  Alert, cooperative patient who appears stated age in no acute distress.  Vital signs reviewed. Cardiac:  Regular rate and rhythm without murmur auscultated.  Good S1/S2. Pulm:  Clear to auscultation bilaterally with good air movement.  No wheezes or rales noted.   Ext:  BL LE's symmetrical, without edema or tenderness today.  Homan's negative. BL       Assessment & Plan:

## 2011-10-31 NOTE — Assessment & Plan Note (Signed)
STable on Xarelto. No further problems.   FU 6 months

## 2011-10-31 NOTE — Assessment & Plan Note (Signed)
Doing well.   No further problems with this.

## 2011-12-19 ENCOUNTER — Other Ambulatory Visit: Payer: Self-pay | Admitting: Obstetrics and Gynecology

## 2011-12-19 DIAGNOSIS — Z1231 Encounter for screening mammogram for malignant neoplasm of breast: Secondary | ICD-10-CM

## 2012-01-27 ENCOUNTER — Ambulatory Visit
Admission: RE | Admit: 2012-01-27 | Discharge: 2012-01-27 | Disposition: A | Discharge status: Home / Self Care | Payer: BC Managed Care – PPO | Source: Ambulatory Visit | Attending: Obstetrics and Gynecology | Admitting: Obstetrics and Gynecology

## 2012-01-27 DIAGNOSIS — Z1231 Encounter for screening mammogram for malignant neoplasm of breast: Secondary | ICD-10-CM

## 2012-04-02 ENCOUNTER — Ambulatory Visit (INDEPENDENT_AMBULATORY_CARE_PROVIDER_SITE_OTHER): Payer: BC Managed Care – PPO | Admitting: Family Medicine

## 2012-04-02 ENCOUNTER — Encounter: Payer: Self-pay | Admitting: Family Medicine

## 2012-04-02 VITALS — BP 132/87 | HR 83 | Temp 98.7°F | Wt 248.0 lb

## 2012-04-02 DIAGNOSIS — R197 Diarrhea, unspecified: Secondary | ICD-10-CM

## 2012-04-02 LAB — CBC WITH DIFFERENTIAL/PLATELET
Basophils Absolute: 0 10*3/uL (ref 0.0–0.1)
Basophils Relative: 0 % (ref 0–1)
Eosinophils Absolute: 0 10*3/uL (ref 0.0–0.7)
Eosinophils Relative: 0 % (ref 0–5)
HCT: 36 % (ref 36.0–46.0)
MCHC: 32.2 g/dL (ref 30.0–36.0)
MCV: 81.4 fL (ref 78.0–100.0)
Monocytes Absolute: 0.5 10*3/uL (ref 0.1–1.0)
RDW: 14 % (ref 11.5–15.5)

## 2012-04-02 LAB — COMPREHENSIVE METABOLIC PANEL
ALT: 9 U/L (ref 0–35)
AST: 15 U/L (ref 0–37)
Chloride: 106 mEq/L (ref 96–112)
Creat: 0.82 mg/dL (ref 0.50–1.10)
Sodium: 138 mEq/L (ref 135–145)
Total Bilirubin: 0.4 mg/dL (ref 0.3–1.2)

## 2012-04-02 NOTE — Patient Instructions (Addendum)
Sorry you are having diarrhea. Most likely is a virus gastroenteritis. Will check labs to make sure no infectious source. If you still have symptoms in 2-3 days, make appointment to be checked again. If you have blood in stool, fevers,. Cant keep liquids down call sooner. You may use pepto bismol or maalox if you desire. Stick with bland foods for now. Avoid fatty foods.  Viral Gastroenteritis Viral gastroenteritis is also known as stomach flu. This condition affects the stomach and intestinal tract. It can cause sudden diarrhea and vomiting. The illness typically lasts 3 to 8 days. Most people develop an immune response that eventually gets rid of the virus. While this natural response develops, the virus can make you quite ill. CAUSES  Many different viruses can cause gastroenteritis, such as rotavirus or noroviruses. You can catch one of these viruses by consuming contaminated food or water. You may also catch a virus by sharing utensils or other personal items with an infected person or by touching a contaminated surface. SYMPTOMS  The most common symptoms are diarrhea and vomiting. These problems can cause a severe loss of body fluids (dehydration) and a body salt (electrolyte) imbalance. Other symptoms may include:  Fever.   Headache.   Fatigue.   Abdominal pain.  DIAGNOSIS  Your caregiver can usually diagnose viral gastroenteritis based on your symptoms and a physical exam. A stool sample may also be taken to test for the presence of viruses or other infections. TREATMENT  This illness typically goes away on its own. Treatments are aimed at rehydration. The most serious cases of viral gastroenteritis involve vomiting so severely that you are not able to keep fluids down. In these cases, fluids must be given through an intravenous line (IV). HOME CARE INSTRUCTIONS   Drink enough fluids to keep your urine clear or pale yellow. Drink small amounts of fluids frequently and increase the  amounts as tolerated.   Ask your caregiver for specific rehydration instructions.   Avoid:   Foods high in sugar.   Alcohol.   Carbonated drinks.   Tobacco.   Juice.   Caffeine drinks.   Extremely hot or cold fluids.   Fatty, greasy foods.   Too much intake of anything at one time.   Dairy products until 24 to 48 hours after diarrhea stops.   You may consume probiotics. Probiotics are active cultures of beneficial bacteria. They may lessen the amount and number of diarrheal stools in adults. Probiotics can be found in yogurt with active cultures and in supplements.   Wash your hands well to avoid spreading the virus.   Only take over-the-counter or prescription medicines for pain, discomfort, or fever as directed by your caregiver. Do not give aspirin to children. Antidiarrheal medicines are not recommended.   Ask your caregiver if you should continue to take your regular prescribed and over-the-counter medicines.   Keep all follow-up appointments as directed by your caregiver.  SEEK IMMEDIATE MEDICAL CARE IF:   You are unable to keep fluids down.   You do not urinate at least once every 6 to 8 hours.   You develop shortness of breath.   You notice blood in your stool or vomit. This may look like coffee grounds.   You have abdominal pain that increases or is concentrated in one small area (localized).   You have persistent vomiting or diarrhea.   You have a fever.   The patient is a child younger than 3 months, and he or she has  a fever.   The patient is a child older than 3 months, and he or she has a fever and persistent symptoms.   The patient is a child older than 3 months, and he or she has a fever and symptoms suddenly get worse.   The patient is a baby, and he or she has no tears when crying.  MAKE SURE YOU:   Understand these instructions.   Will watch your condition.   Will get help right away if you are not doing well or get worse.  Document  Released: 06/20/2005 Document Revised: 06/09/2011 Document Reviewed: 04/06/2011 [facility] Patient Information 2012 [place], [place].

## 2012-04-02 NOTE — Progress Notes (Signed)
  Subjective:    Patient ID: Carla Alexander, female    DOB: Feb 17, 1971, 41 y.o.   MRN: [id number]  HPI  1. Diarrhea. C/o watery diarrhea after meals that started 3 days ago after eating lunch on Friday. She states that the food she eats "just passes through me." has mild sensation of nausea and some cramping associated with BMs. Has counted 10 x watery stools in the past 18 hours. She ate a hamburger and fries for dinner last evening. Denies known exposure to spoiled food, contaminated water, petting zoo, travelers, outside water sources/camping, antibiotics. She works at an [employer], [employer].   Denies fever, blood in stool, chills, sweats, rash, emesis, dysuria, abdominal pain, history of prior episodes.  Review of Systems See HPI otherwise negative.  reports that she has never smoked. She has never used smokeless tobacco.     Objective:   Physical Exam  Vitals reviewed. Constitutional: She is oriented to person, place, and time. She appears well-developed and well-nourished. No distress.  HENT:  Head: Normocephalic and atraumatic.  Mouth/Throat: Oropharynx is clear and moist. No oropharyngeal exudate.  Eyes: EOM are normal. Pupils are equal, round, and reactive to light.  Neck: Neck supple. No thyromegaly present.  Cardiovascular: Normal rate, regular rhythm, normal heart sounds and intact distal pulses.   No murmur heard. Pulmonary/Chest: Effort normal and breath sounds normal. No respiratory distress. She has no wheezes. She has no rales.  Abdominal: Soft. She exhibits no distension. There is no tenderness. There is no rebound and no guarding.       Hyperactive BS  Musculoskeletal: She exhibits no edema.  Neurological: She is alert and oriented to person, place, and time. No cranial nerve deficit.  Skin: No rash noted. She is not diaphoretic.  Psychiatric: She has a normal mood and affect.       Assessment & Plan:

## 2012-04-02 NOTE — Assessment & Plan Note (Signed)
3 days persistent watery diarrhea after meals. No history of exposure, travel, recent abx to suggest serious bacterial infection or inflammatory disease. Most likely viral gastroenteritis, no sign of dehydration on exam. Advised patient to continue pushing fluids, stick with bland foods (avoid burger/fatty foods) for now. Wash hands frequently, monitor for development of red flags such as fever, failure to improve or abdominal pain and f/u if not improved in 2-3 days as she may warrant stool studies. Advised she may use pepto bismol or OTC anti-diarrheal if desired, as she has low risk of infectious diarrhea.

## 2012-04-03 ENCOUNTER — Telehealth: Payer: Self-pay | Admitting: *Deleted

## 2012-04-03 NOTE — Telephone Encounter (Signed)
Message copied by Farrell Ours on Tue Apr 03, 2012  2:14 PM ------      Message from: Durwin Reges      Created: Tue Apr 03, 2012  8:47 AM      Regarding: normal labs       Please notify patient labs are normal.

## 2012-04-03 NOTE — Telephone Encounter (Signed)
Error

## 2012-04-03 NOTE — Telephone Encounter (Signed)
Message copied by Deno Etienne on Tue Apr 03, 2012  8:50 AM ------      Message from: Durwin Reges      Created: Tue Apr 03, 2012  8:47 AM      Regarding: normal labs       Please notify patient labs are normal.

## 2012-04-03 NOTE — Telephone Encounter (Signed)
Called to inform pt that her labs were normal. She stated that she is using the pepto bismuth and its not helping. I told her that if her sx persist that she will need to come back in. I told her to try the imodium for diarrhea and if she continues to have diarrhea and fevers she will need to make an appt to be seen. Pt voiced understanding and agreed.Carla Alexander [place]

## 2012-04-05 ENCOUNTER — Encounter: Payer: Self-pay | Admitting: Family Medicine

## 2012-04-05 ENCOUNTER — Telehealth: Payer: Self-pay | Admitting: Family Medicine

## 2012-04-05 ENCOUNTER — Ambulatory Visit (INDEPENDENT_AMBULATORY_CARE_PROVIDER_SITE_OTHER): Payer: BC Managed Care – PPO | Admitting: Family Medicine

## 2012-04-05 VITALS — BP 124/82 | HR 103 | Temp 98.4°F | Ht 68.5 in | Wt 235.0 lb

## 2012-04-05 DIAGNOSIS — R197 Diarrhea, unspecified: Secondary | ICD-10-CM

## 2012-04-05 NOTE — Patient Instructions (Addendum)
Ok to immodium for diarrhea Try G2 gatorade (reduced sugar) and sips of water to stay hydrated  Viral Gastroenteritis Viral gastroenteritis is also known as stomach flu. This condition affects the stomach and intestinal tract. It can cause sudden diarrhea and vomiting. The illness typically lasts 3 to 8 days. Most people develop an immune response that eventually gets rid of the virus. While this natural response develops, the virus can make you quite ill. CAUSES   Many different viruses can cause gastroenteritis, such as rotavirus or noroviruses. You can catch one of these viruses by consuming contaminated food or water. You may also catch a virus by sharing utensils or other personal items with an infected person or by touching a contaminated surface. SYMPTOMS   The most common symptoms are diarrhea and vomiting. These problems can cause a severe loss of body fluids (dehydration) and a body salt (electrolyte) imbalance. Other symptoms may include:  Fever.   Headache.   Fatigue.   Abdominal pain.  DIAGNOSIS   Your caregiver can usually diagnose viral gastroenteritis based on your symptoms and a physical exam. A stool sample may also be taken to test for the presence of viruses or other infections. TREATMENT   This illness typically goes away on its own. Treatments are aimed at rehydration. The most serious cases of viral gastroenteritis involve vomiting so severely that you are not able to keep fluids down. In these cases, fluids must be given through an intravenous line (IV). HOME CARE INSTRUCTIONS    Drink enough fluids to keep your urine clear or pale yellow. Drink small amounts of fluids frequently and increase the amounts as tolerated.   Ask your caregiver for specific rehydration instructions.   Avoid:   Foods high in sugar.   Alcohol.   Carbonated drinks.   Tobacco.   Juice.   Caffeine drinks.   Extremely hot or cold fluids.   Fatty, greasy foods.   Too much  intake of anything at one time.   Dairy products until 24 to 48 hours after diarrhea stops.   You may consume probiotics. Probiotics are active cultures of beneficial bacteria. They may lessen the amount and number of diarrheal stools in adults. Probiotics can be found in yogurt with active cultures and in supplements.   Wash your hands well to avoid spreading the virus.   Only take over-the-counter or prescription medicines for pain, discomfort, or fever as directed by your caregiver. Do not give aspirin to children. Antidiarrheal medicines are not recommended.   Ask your caregiver if you should continue to take your regular prescribed and over-the-counter medicines.   Keep all follow-up appointments as directed by your caregiver.  SEEK IMMEDIATE MEDICAL CARE IF:    You are unable to keep fluids down.   You do not urinate at least once every 6 to 8 hours.   You develop shortness of breath.   You notice blood in your stool or vomit. This may look like coffee grounds.   You have abdominal pain that increases or is concentrated in one small area (localized).   You have persistent vomiting or diarrhea.   You have a fever.   The patient is a child younger than 3 months, and he or she has a fever.   The patient is a child older than 3 months, and he or she has a fever and persistent symptoms.   The patient is a child older than 3 months, and he or she has a fever and symptoms  suddenly get worse.   The patient is a baby, and he or she has no tears when crying.  MAKE SURE YOU:    Understand these instructions.   Will watch your condition.   Will get help right away if you are not doing well or get worse.  Document Released: 06/20/2005 Document Revised: 09/12/2011 Document Reviewed: 04/06/2011 [facility] Patient Information 2013 [place], [place].

## 2012-04-05 NOTE — Telephone Encounter (Signed)
Returned call to patient.  Had been having diarrhea every 30-40 minutes.  Now has diarrhea about every 90 minutes.  Has had 10+ stools in last 24 hours.  Pain is a little better but patient unable to return to work due to diarrhea and pain.  Patient concerned about dehydration and her meds being able to be absorbed due to her diarrhea.  Is trying to drink and stay hydrated, but "everything I drink passes through me."  Will have patient come in today to be re-evaluated.  Appt scheduled with Dr. Earnest Bailey for today at 10:15am.  Gaylene Brooks, RN

## 2012-04-05 NOTE — Assessment & Plan Note (Signed)
Advised fluid- electrolyte repletion with low glucose drink, may use imodium prn.  Suspect improving acute viral diarrhea, no evidence of bacterial or serious intraabdominal pathology today.  Discussed red flags for sooner return.

## 2012-04-05 NOTE — Progress Notes (Signed)
  Subjective:    Patient ID: Carla Alexander, female    DOB: 1971-03-12, 41 y.o.   MRN: [id number]  HPI 41 yo here for diarrhea and abdominal cramping  Reports 4 days of diarhea and abdominal cramping with nausea.  Diarrhea described as watery with brown, no blood, no mucous.  Lower abdominal cramping.  No urinary or vagina symptoms.  No sick contacts. No travel.  No rash, no fever.  2 days ago: 12 episodes of diarrhea yesertday 11 episodes Today: 6 thus far  Overall feels may be slowing some.    Review of Systemssee HPi     Objective:   Physical Exam GEN: Alert & Oriented, No acute distress CV:  Regular Rate & Rhythm, no murmur Respiratory:  Normal work of breathing, CTAB Abd:  + BS, soft, no tenderness to palpation, benign Ext: no pre-tibial edema        Assessment & Plan:

## 2012-04-05 NOTE — Telephone Encounter (Signed)
Patient is calling because she was seen earlier in the week and was Dx'd with a stomach flu.  She is concerned that because she still has symptoms and everything she puts in her mouth goes right through her, that the medications she takes, ie blood thinners, isn't able to absorb in her body to do what they need to do.  She would like to speak to the nurse about this.

## 2012-04-09 ENCOUNTER — Ambulatory Visit (INDEPENDENT_AMBULATORY_CARE_PROVIDER_SITE_OTHER): Payer: BC Managed Care – PPO | Admitting: Family Medicine

## 2012-04-09 ENCOUNTER — Encounter: Payer: Self-pay | Admitting: Family Medicine

## 2012-04-09 ENCOUNTER — Ambulatory Visit (HOSPITAL_COMMUNITY)
Admission: RE | Admit: 2012-04-09 | Discharge: 2012-04-09 | Disposition: A | Discharge status: Home / Self Care | Payer: BC Managed Care – PPO | Source: Ambulatory Visit | Attending: Family Medicine | Admitting: Family Medicine

## 2012-04-09 VITALS — BP 119/83 | HR 93 | Temp 99.6°F | Ht 68.5 in | Wt 232.0 lb

## 2012-04-09 DIAGNOSIS — R197 Diarrhea, unspecified: Secondary | ICD-10-CM

## 2012-04-09 DIAGNOSIS — R071 Chest pain on breathing: Secondary | ICD-10-CM

## 2012-04-09 MED ORDER — IOHEXOL 350 MG/ML SOLN
100.0000 mL | Freq: Once | INTRAVENOUS | Status: AC | PRN
  Administered 2012-04-09: 100 mL via INTRAVENOUS

## 2012-04-09 NOTE — Assessment & Plan Note (Signed)
Patient unable to give stool sample in office, she will return sample for leukocytes, culture, c diff.  Electrolytes checked at peak of illness, were ok.  Will continue supportive care, encouraged to use imodium for symptoms relief if needed.  Discussed red flags for return

## 2012-04-09 NOTE — Patient Instructions (Addendum)
Need chest imaging today  Will collect stool samples  Use imodium for diarrhea  If worsening pain, or other concerns, go to ER

## 2012-04-09 NOTE — Progress Notes (Signed)
  Subjective:    Patient ID: HALINA LAWHORNE, female    DOB: 1971/05/19, 41 y.o.   MRN: [id number]  HPI Work in for continued diarrhea.  7-10 days of diarrhea.  Continued watery diarrhea up to 10 times per day.  No blood.  Reports green tinge.  Slightly more solid than previous.  Abdominal cramping is improved.  NO vomiting. No fever.  Reports acute onset of pleuritic chest pain 2 days ago.  States feels even shot of breath speaking to me.  Worse when taking deep breaths.  Intermittent.  No radiation.  I have reviewed patient's  PMH, FH, and Social history and Medications as related to this visit. 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  Review of Systems    see HPi Objective:   Physical Exam GEN: Alert & Oriented, No acute distress, she appears more comfortable than stated CV:  Regular Rate & Rhythm, no murmur, pain in mid chest reproducible on palpation.   Respiratory:  Normal work of breathing, CTAB Abd:  + BS, soft, no tenderness to palpation Ext: no pre-tibial edema, no calf tenderness or edema        Assessment & Plan:

## 2012-04-09 NOTE — Assessment & Plan Note (Signed)
Patient reports acute onset of pleuritic chest pain and dyspnea.  Vitals and exam today in office normal.  Due to high risk given multiple dvt's this year, ordered CTA which was normal.  Likely multifactorial with mild dehydration and anxiety contributing to dyspnea and fatigue.

## 2012-04-10 NOTE — Addendum Note (Signed)
Addended by: Herminio Heads on: 04/10/2012 02:04 PM   Modules accepted: Orders

## 2012-04-16 ENCOUNTER — Telehealth: Payer: Self-pay | Admitting: Family Medicine

## 2012-04-16 NOTE — Telephone Encounter (Signed)
Left message on voicemail stating was checking on her to see how she was feeling and to let her know stool cultures did not show infection.  Invited her to call back office with concerns.

## 2012-04-26 ENCOUNTER — Encounter: Payer: Self-pay | Admitting: Family Medicine

## 2012-05-01 ENCOUNTER — Telehealth: Payer: Self-pay | Admitting: Family Medicine

## 2012-05-01 NOTE — Telephone Encounter (Signed)
Pt rec'd a letter from Dr Claiborne Billings and needs to know what it means.  pls advise

## 2012-05-02 NOTE — Telephone Encounter (Signed)
Patient called back today again wanting to know that the letter meant.  I apologized for its confusing nature and took the time to explain it.  I basically told her that there were traces of WBCs in her stool which indicated some infectious process.  I assured her that given that her symptoms had resolved there was probably nothing to worry about.  Told her that I would route a summary of this conversation to Dr. Claiborne Billings and if there was any additional information to add, she would call her.  Patient appreciative.

## 2012-05-03 NOTE — Telephone Encounter (Signed)
This patient was seen by Dr. Earnest Bailey. I never was able to evaluate her. However, Dr. Earnest Bailey called her to tell her about her lab results on the 14th. The letter was just for her records and contained no new information. Thank you.

## 2012-05-23 ENCOUNTER — Encounter: Payer: BC Managed Care – PPO | Admitting: Obstetrics and Gynecology

## 2012-06-17 ENCOUNTER — Other Ambulatory Visit: Payer: Self-pay | Admitting: Family Medicine

## 2012-06-18 NOTE — Telephone Encounter (Signed)
I would like this patient to make an appointment for the beginning of the year for new doctor establishment. I am not familiar with her. I will refill her prescription of Xarelto as desired.  Thanks!

## 2012-06-20 ENCOUNTER — Telehealth: Payer: Self-pay | Admitting: *Deleted

## 2012-06-20 NOTE — Telephone Encounter (Signed)
Spoke with patient and she has an appointment 1/7 with Dr. Claiborne Billings and informed her of below

## 2012-06-20 NOTE — Telephone Encounter (Signed)
Called in rx that was requested to [facility] [address]. Patient is to make an appointment beginning of year in order to get any more refills and to meet new PCP

## 2012-06-26 ENCOUNTER — Encounter: Payer: Self-pay | Admitting: Obstetrics and Gynecology

## 2012-06-26 ENCOUNTER — Ambulatory Visit (INDEPENDENT_AMBULATORY_CARE_PROVIDER_SITE_OTHER): Payer: BC Managed Care – PPO | Admitting: Obstetrics and Gynecology

## 2012-06-26 VITALS — BP 124/76 | Ht 69.0 in | Wt 222.0 lb

## 2012-06-26 DIAGNOSIS — Z01419 Encounter for gynecological examination (general) (routine) without abnormal findings: Secondary | ICD-10-CM

## 2012-06-26 NOTE — Patient Instructions (Signed)
Continue yearly mammograms 

## 2012-06-26 NOTE — Progress Notes (Signed)
Patient came to see me today for her annual GYN exam. She had a laparoscopic-assisted vaginal hysterectomy in 2010 for fibroids and endometriosis. She is now asymptomatic. She is having no vaginal bleeding. She is having no pelvic pain. She is having no symptoms of menopause.She had a normal mammogram this year. She has always had normal Pap smears. Her last Pap smear was 2012. 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 She is on xarelto for anticoagulation without problems. She had a negative workup. She does her lab through her PCP.  HEENT: Within normal limits.Kennon Portela present. Neck: No masses. Supraclavicular lymph nodes: Not enlarged. Breasts: Examined in both sitting and lying position. Symmetrical without skin changes or masses. Abdomen: Soft no masses guarding or rebound. No hernias. Pelvic: External within normal limits. BUS within normal limits. Vaginal examination shows good estrogen effect, no cystocele enterocele or rectocele. Cervix and uterus absent. Adnexa within normal limits. Rectovaginal confirmatory. Extremities within normal limits.  Assessment: Normal GYN exam. History of endometriosis.  Plan: Continue yearly mammograms. Pap Not done.The new Pap smear guidelines were discussed with the patient.

## 2012-06-27 LAB — URINALYSIS W MICROSCOPIC + REFLEX CULTURE
Glucose, UA: NEGATIVE mg/dL
Leukocytes, UA: NEGATIVE
Protein, ur: NEGATIVE mg/dL
pH: 5.5 (ref 5.0–8.0)

## 2012-07-10 ENCOUNTER — Encounter: Payer: Self-pay | Admitting: Family Medicine

## 2012-07-10 ENCOUNTER — Ambulatory Visit (INDEPENDENT_AMBULATORY_CARE_PROVIDER_SITE_OTHER): Payer: BC Managed Care – PPO | Admitting: Family Medicine

## 2012-07-10 VITALS — BP 135/85 | HR 73 | Temp 99.0°F | Wt 252.0 lb

## 2012-07-10 DIAGNOSIS — I82409 Acute embolism and thrombosis of unspecified deep veins of unspecified lower extremity: Secondary | ICD-10-CM

## 2012-07-10 NOTE — Assessment & Plan Note (Signed)
-   Patient has been doing well on Xarelto 15 mg.  - She has no complaints.  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  - F/U: as needed

## 2012-07-10 NOTE — Progress Notes (Signed)
Subjective:     Patient ID: Carla Alexander, female   DOB: 1971/07/02, 42 y.o.   MRN: [id number]  HPI  Patient is a new patient to me. 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 She has been on xarelto and doing well. She denies swelling, redness or pain. She has no other complaints. She is unhappy with her weight, and admits she needs to diet.   She declines the flu shot today. She is UTD on Td and will require it next year in December. She has had a normal PAP 06/2012 and denies any pelvic pain or complication with her hysterectomy. She has had no symptoms of menopause. She takes no other medications.  She has had a LDL and TSH this year that were in normal range.    Review of Systems See above HPI    Objective:   Physical Exam  Constitutional: She is oriented to person, place, and time. She appears well-developed and well-nourished. No distress.       obese  HENT:  Head: Normocephalic and atraumatic.  Neck: Neck supple. No thyromegaly present.  Cardiovascular: Normal rate, regular rhythm and normal heart sounds.   Pulmonary/Chest: Effort normal and breath sounds normal. No respiratory distress. She has no wheezes.  Abdominal: Soft. Bowel sounds are normal. She exhibits no distension. There is no tenderness. There is no rebound and no guarding.  Musculoskeletal: She exhibits no edema and no tenderness.       EXT: +2/4 pulses bilaterally. NO edema, erythema, swelling or tenderness bilaterally. Homan's negative.  Lymphadenopathy:    She has no cervical adenopathy.  Neurological: She is alert and oriented to person, place, and time.  Skin: Skin is warm and dry. No erythema.  Psychiatric: She has a normal mood and affect.

## 2012-07-10 NOTE — Patient Instructions (Signed)
It was a pleasure meeting you today. It appears you currently need no immunizations. You will be due next year for a Tetanus shot. Since your PAP smears have always been normal you will not need another pelvic for 3 years. Your mammogram was normal last year.   Please feel free to call if you need anything.  Venous Thromboembolism Venous thromboembolism (VTE) is a condition where a blood clot (thrombus) develops in the body. A thrombus usually occurs in a deep vein in the leg or pelvis but can occur in an upper extremity. Sometimes pieces of the thrombus can break off from its original place of development and travel through the bloodstream to other parts of the body. When a thrombus breaks off and travels through the bloodstream, it is called an embolism. The embolism can block the blood flow in the blood vessels of other organs. There are two two serious types of VTE:  Deep vein thrombosis (DVT). A DVT is a thrombus that usually occurs in a deep vein of the lower legs, pelvis, or in an upper extremity.  Pulmonary embolism (PE). A PE occurs when an embolism has formed and traveled to the lungs. A PE can block or decrease the blood flow in one or both lungs.  Venous thromboembolism is a serious health condition that can cause disability or death. It is very important to not ignore symptoms or delay treatment.  CAUSES   A blood clot can form in a vein from different conditions. A blood clot can develop due to:  Blood flow within a vein that is sluggish or very slow.  Medical conditions that make the blood clot easily.  Vein damage. RISK FACTORS Risk factors can increase your risk of developing a blood clot. Risk factors can include:  Smoking.  Obesity.  Age.  Immobility or sedentary lifestyle.  Sitting or standing for long periods of time.  Chronic or long-term bedrest.  Medical or past history of blood clots.  Family history of blood clots.  Hip, leg, or pelvis injury or  trauma.  Major surgery, especially surgery on the hip, knee, or abdomen.  Pregnancy and childbirth.  Birth control pills and hormone replacement therapy.  Medical conditions such as  Peripheral vascular disease (PVD).  Diabetes.  Cancer. SYMPTOMS  Symptoms of VTE can depend on where the clot is located and if the clot breaks off and travels to another organ. Sometimes, there may be no symptoms.   DVT symptoms can include:  Swelling of the leg or arm, especially on one side.  Warmth and redness of the leg or arm, especially on one side.  Pain in an arm or leg. Leg pain may be more noticeable or worse when standing or walking.  PE symptoms can include:  Shortness of breath.  Coughing.  Coughing up blood or blood-tinged mucus (hemoptysis).  Chest pain or chest pain with deep breaths (pleuritic chest pain).  Apprehension, anxiety, or a feeling of impending doom.  Rapid heartbeat. A PE is a medical emergency. Call your local emergency services (911 in [place] if you have these symptoms. DIAGNOSIS  A venous thromboembolism is diagnosed by:  Looking at your medical history and risk factors. Your caregiver will perform a physical exam.  Blood tests, including blood work of how your blood clots.  Imaging tests that can detect a blood clot may be ordered. These can include:  Ultrasonography.  Computed Tomography (CT) scan.  Magnetic Resonance Imaging (MRI).  Echocardiography.  Electrocardiography. TREATMENT  Initial treatment:  When a venous thromboembolism has been confirmed, initial treatment consists of using blood thinning (anticoagulant) medicines. Anticoagulant medicines affect how your blood clots and can cause bleeding. Therefore, your blood clotting times are monitored by blood tests called prothrombin time (PT) and International Normalized Ratio (INR) when you are on anticoagulants. Typically, the anticoagulants are intravenous (IV) heparin and warfarin. IV  heparin is normally started right away because IV heparin has a rapid onset of action and thins the blood quickly. Warfarin is also started with IV heparin therapy. Warfarin has a slower onset of action and takes longer to work. This overlap therapy of IV heparin and oral warfarin is continued until PT and INR levels are therapeutic. After the PT and INR levels are therapeutic, IV heparin is discontinued and you are maintained on warfarin.  Other treatment options:  Catheter-directed thrombolysis. This is a clot-busting therapy for a DVT in which a small, flexible hollow tube (catheter) is threaded to the blood clot inside the vein. A clot-busting drug (thrombolytic) is then infused through the catheter. The thrombolytic helps to break up the clot in the vein and restore blood flow.  Direct thrombin inhibitor (DTI) medicine. A DTI is an anticoagulant that slows blood clotting. It is given through an IV.  If you cannot take an anticoagulant, a filter called an inferior vena cava filter (IVC filter) can be placed. The IVC filter is placed in a large vein, in either your leg or abdomen. An IVC filter is left in the vein permanently. The IVC filter can help prevent blood clots from going to your lungs.  Surgery. Blood clots may need to be removed surgically if other treatment options are not working or cannot be used. Types of surgery can include:  Thrombectomy.  Embolectomy.  Venous stenting.  Pain medicine (analgesic). Medicine to control pain is given in addition to the above treatment options. Home treatment:  Continued treatment at home consists of taking either warfarin or under-the-skin (subcutaneous) injections of an anticoagulant. PREVENTION   In-hospital prevention:  Activity. Getting out of bed and walking while you are in the hospital can help prevent blood clots.  Medicines may be given to help prevent blood clots.  Sequential compression device (SCD). A SCD can help prevent  blood clots in the lower legs. A compression sleeve is wrapped around each of your legs. The tubing of the sleeve is connected to a machine that pumps air into the compression sleeve. The pumping action of the sleeve helps circulate the blood in your legs.  Compression stockings. Compression stockings are tight, elastic stockings that apply pressure to the lower legs and help prevent blood from pooling in the lower legs. Compression stockings are sometimes used with SCDs.  General prevention:  Exercise regularly if you are able.  Avoid sitting or lying in bed for long periods of time without moving the legs.  Do not smoke. If you smoke, quit. Ask your caregiver for help.  Avoid exposure to smoke.  Maintain a healthy weight.  Women over the age of 61 should consider the risk of blood clots while taking birth control pills or hormone replacement therapy.  Long distance travel along with prolonged sitting and standing can increase the risk of a DVT. Exercise your legs by walking or by pumping your leg muscles every hour. HOME CARE INSTRUCTIONS   Take all medicines prescribed by your caregiver. Follow the directions carefully.  Warfarin. Most people will continue taking warfarin. Your caregiver will advise you on the length  of treatment (usually 3 to 6 months, sometimes lifelong).  Too much and too little warfarin are both dangerous. Too much warfarin increases the risk of bleeding. Too little warfarin continues to allow the risk for blood clots. While taking warfarin, you will need to have regular blood tests to measure your blood clotting time. These blood tests usually include both the PT and INR tests. The PT and INR results allow your caregiver to adjust your dose of warfarin. The dose can change for many reasons. It is critically important that you take warfarin exactly as prescribed, and that you have your PT and INR levels drawn exactly as directed.  Many foods, especially foods high  in vitamin K can interfere with warfarin and affect the PT and INR results. Foods high in vitamin K include spinach, kale, broccoli, cabbage, collard and turnip greens, brussels sprouts, peas, cauliflower, seaweed, and parsley as well as beef and pork liver, green tea, and soybean oil. You should eat a consistent amount of foods high in vitamin K. Avoid major changes in your diet, or notify your caregiver before changing your diet. Arrange a visit with a dietitian to answer your questions.  Many medicines can interfere with warfarin and affect the PT and INR results. You must tell your caregiver about any and all medicines you take, this includes all vitamins and supplements. Be especially cautious with aspirin and anti-inflammatory medicines. Do not take or discontinue any prescribed or over-the-counter medicine except on the advice of your caregiver or pharmacist.  Warfarin can have side effects, such as excessive bruising or bleeding. You will need to hold pressure over cuts for longer than usual. Your caregiver or pharmacist will discuss other potential side effects.  Avoid sports or activities that may cause injury or bleeding.  Be mindful when shaving, flossing your teeth, or handling sharp objects.  Alcohol can change the body's ability to handle warfarin. It is best to avoid alcoholic drinks or consume only very small amounts while taking warfarin. Notify your caregiver if you change your alcohol intake.  Notify your dentist or other caregivers before procedures.  Activity. Ask your caregiver how soon you can go back to normal activities if you have had a blood blot.  Exercise. It is very important to exercise and stay active to prevent future blood clots. This is especially important while traveling, sitting, or standing for long periods of time. Exercise your legs by walking or by pumping the muscles frequently.  Compression stockings. You may need to wear compression stockings to help  prevent a DVT.  Smoking. If you smoke, quit. Ask your caregiver for help with quitting smoking.  Learn as much as you can about VTE. Educating yourself can help prevent VTE from reoccurring.  Wear a medical alert bracelet or carry a medical alert card. SEEK MEDICAL CARE IF:   You feel faint or dizzy.  You feel rapid or skipped heartbeats.  You feel weaker or more tired than usual.  You feel you are not getting better in the time expected.  You believe you are having side effects from medicine.  You have joint pain.  You have abdominal pain.  You have new or increased bruising. SEEK IMMEDIATE MEDICAL CARE IF:   You vomit bright red blood or your vomit has a coffee ground type appearance.  You have bowel movements that have bright red blood or are dark or tarry in appearance.  You have bleeding from your rectum.  You have pink or bloody urine.  You develop breathing problems such as shortness of breath or pain with breathing.  You are coughing up blood.  You develop warmth, swelling, or redness in an arm or a leg.  You have chest pain.  You have a sudden, unexplained severe headache.  You have a cut that does not stop bleeding after 10 minutes.  You have a nosebleed that does not stop bleeding after 10 minutes. Document Released: 04/17/2009 Document Revised: 12/20/2011 Document Reviewed: 11/30/2011 [facility] Patient Information 2013 [place], [place].

## 2013-02-14 ENCOUNTER — Other Ambulatory Visit: Payer: Self-pay | Admitting: Family Medicine

## 2013-05-21 ENCOUNTER — Telehealth: Payer: Self-pay | Admitting: Family Medicine

## 2013-05-21 NOTE — Telephone Encounter (Signed)
Patient has purchased "Yogi Detox Tea" from [place] to "clean out my system and the colon and liver."  It is a 10-day regimen.  Has had one glass this morning.  Was reading the bottle and it states to call the doctor before starting the detox.  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  Advised patient that we do not recommend colon cleansing and not sure how it will affect/interact with her meds.  Patient has not been seen here since Jan. 2014 and will call back to schedule a followup appt for Jan. 2015.  Gaylene Brooks, RN

## 2013-07-03 ENCOUNTER — Ambulatory Visit (INDEPENDENT_AMBULATORY_CARE_PROVIDER_SITE_OTHER): Payer: BC Managed Care – PPO | Admitting: Family Medicine

## 2013-07-03 ENCOUNTER — Encounter: Payer: Self-pay | Admitting: Family Medicine

## 2013-07-03 VITALS — BP 135/77 | HR 77 | Temp 99.6°F | Ht 68.5 in | Wt 248.0 lb

## 2013-07-03 DIAGNOSIS — Z23 Encounter for immunization: Secondary | ICD-10-CM

## 2013-07-03 DIAGNOSIS — I82409 Acute embolism and thrombosis of unspecified deep veins of unspecified lower extremity: Secondary | ICD-10-CM

## 2013-07-03 DIAGNOSIS — Z0000 Encounter for general adult medical examination without abnormal findings: Secondary | ICD-10-CM

## 2013-07-03 LAB — COMPLETE METABOLIC PANEL WITH GFR
BUN: 13 mg/dL (ref 6–23)
CO2: 27 mEq/L (ref 19–32)
Creat: 0.91 mg/dL (ref 0.50–1.10)
GFR, Est African American: 89 mL/min
GFR, Est Non African American: 78 mL/min
Glucose, Bld: 79 mg/dL (ref 70–99)
Total Bilirubin: 0.6 mg/dL (ref 0.3–1.2)

## 2013-07-03 LAB — CBC WITH DIFFERENTIAL/PLATELET
Eosinophils Absolute: 0.1 10*3/uL (ref 0.0–0.7)
Eosinophils Relative: 1 % (ref 0–5)
Lymphs Abs: 2.4 10*3/uL (ref 0.7–4.0)
MCH: 26.4 pg (ref 26.0–34.0)
MCV: 78.2 fL (ref 78.0–100.0)
Monocytes Absolute: 0.9 10*3/uL (ref 0.1–1.0)
Monocytes Relative: 12 % (ref 3–12)
Platelets: 332 10*3/uL (ref 150–400)
RBC: 4.54 MIL/uL (ref 3.87–5.11)

## 2013-07-03 LAB — TSH: TSH: 0.758 u[IU]/mL (ref 0.350–4.500)

## 2013-07-03 LAB — LDL CHOLESTEROL, DIRECT: Direct LDL: 142 mg/dL — ABNORMAL HIGH

## 2013-07-03 MED ORDER — RIVAROXABAN 20 MG PO TABS: 20.0000 mg | ORAL_TABLET | Freq: Every day | ORAL | Status: DC

## 2013-07-03 NOTE — Assessment & Plan Note (Addendum)
Prescribe xarelto 20 mg daily. Uncertain why patient was on 15 mg dose. Kidney function appears to be normal. AVS on xarelto given.  Red flags for DVTs given  Repeat labs performed today CBC, CMP, TSH and direct LDL. Will call patient with results

## 2013-07-03 NOTE — Addendum Note (Signed)
Addended by: Tanna Savoy on: 07/03/2013 01:46 PM   Modules accepted: Orders

## 2013-07-03 NOTE — Patient Instructions (Signed)
Rivaroxaban oral tablets What is this medicine? RIVAROXABAN (ri va ROX a ban) is an anticoagulant (blood thinner). It is used to treat blood clots in the lungs or in the veins. It is also used after knee or hip surgeries to prevent blood clots. It is also used to lower the chance of stroke in people with a medical condition called atrial fibrillation. This medicine may be used for other purposes; ask your health care provider or pharmacist if you have questions. COMMON BRAND NAME(S): Xarelto What should I tell my health care provider before I take this medicine? They need to know if you have any of these conditions: -bleeding disorders -bleeding in the brain -blood in your stools (black or tarry stools) or if you have blood in your vomit -history of stomach bleeding -kidney disease -liver disease -low blood counts, like low white cell, platelet, or red cell counts -recent or planned spinal or epidural procedure -take medicines that treat or prevent blood clots -an unusual or allergic reaction to rivaroxaban, other medicines, foods, dyes, or preservatives -pregnant or trying to get pregnant -breast-feeding How should I use this medicine? Take this medicine by mouth with a glass of water. Follow the directions on the prescription label. Take your medicine at regular intervals. Do not take it more often than directed. Do not stop taking except on your doctor's advice. Stopping this medicine may increase your risk of a blot clot. Be sure to refill your prescription before you run out of medicine. If you are taking this medicine after hip or knee replacement surgery, take it with or without food. If you are taking this medicine for atrial fibrillation, take it with your evening meal. If you are taking this medicine to treat blood clots, take it with food at the same time each day. If you are unable to swallow your tablet, you may crush the tablet and mix it in applesauce. Then, immediately eat the  applesauce. You should eat more food right after you eat the applesauce containing the crushed tablet. Talk to your pediatrician regarding the use of this medicine in children. Special care may be needed. Overdosage: If you think you have taken too much of this medicine contact a poison control center or emergency room at once. NOTE: This medicine is only for you. Do not share this medicine with others. What if I miss a dose? If you take your medicine once a day and miss a dose, take the missed dose as soon as you remember. If you take your medicine twice a day and miss a dose, take the missed dose immediately. In this instance, 2 tablets may be taken at the same time. The next day you should take 1 tablet twice a day as directed. What may interact with this medicine? -aspirin and aspirin-like medicines -certain antibiotics like erythromycin, azithromycin, and clarithromycin -certain medicines for fungal infections like ketoconazole and itraconazole -certain medicines for irregular heart beat like amiodarone, quinidine, dronedarone -certain medicines for seizures like carbamazepine, phenytoin -certain medicines that treat or prevent blood clots like warfarin, enoxaparin, and dalteparin  -conivaptan -diltiazem -felodipine -indinavir -lopinavir; ritonavir -NSAIDS, medicines for pain and inflammation, like ibuprofen or naproxen -ranolazine -rifampin -ritonavir -St. John's wort -verapamil This list may not describe all possible interactions. Give your health care provider a list of all the medicines, herbs, non-prescription drugs, or dietary supplements you use. Also tell them if you smoke, drink alcohol, or use illegal drugs. Some items may interact with your medicine. What should I   watch for while using this medicine? Visit your doctor or health care professional for regular checks on your progress. Your condition will be monitored carefully while you are receiving this medicine. If you are  going to have surgery, tell your doctor or health care professional that you are taking this medicine. Avoid sports and activities that might cause injury while you are using this medicine. Severe falls or injuries can cause unseen bleeding. Be careful when using sharp tools or knives. Consider using an electric razor. Take special care brushing or flossing your teeth. Report any injuries, bruising, or red spots on the skin to your doctor or health care professional. What side effects may I notice from receiving this medicine? Side effects that you should report to your doctor or health care professional as soon as possible: -allergic reactions like skin rash, itching or hives, swelling of the face, lips, or tongue -back pain -confusion, trouble speaking or understanding -redness, blistering, peeling or loosening of the skin, including inside the mouth -signs and symptoms of bleeding such as bloody or black, tarry stools; red or dark-brown urine; spitting up blood or brown material that looks like coffee grounds; red spots on the skin; unusual bruising or bleeding from the eye, gums, or nose -signs and symptoms of a blood clot such as breathing problems; changes in vision; chest pain; severe, sudden headache; pain, swelling, warmth in the leg; trouble speaking; sudden numbness or weakness of the face, arm, or leg -trouble walking, dizziness, loss of balance or coordination  Side effects that usually do not require medical attention (Report these to your doctor or health care professional if they continue or are bothersome.): -dizziness -muscle pain This list may not describe all possible side effects. Call your doctor for medical advice about side effects. You may report side effects to FDA at 1-800-FDA-1088. Where should I keep my medicine? Keep out of the reach of children. Store at room temperature between 15 and 30 degrees C (59 and 86 degrees F). Throw away any unused medicine after the  expiration date. NOTE: This sheet is a summary. It may not cover all possible information. If you have questions about this medicine, talk to your doctor, pharmacist, or health care provider.  2014, [employer]vier/[employer] Standard. (2012-12-05 09:51:31)  

## 2013-07-03 NOTE — Assessment & Plan Note (Signed)
Patient declines flu shot. Tetanus shot given today Information on mammogram, advised patient to schedule within the beginning of the year. Pap due September 2015, prior Pap smears were normal. Patient apparently has supracervical hysterectomy.

## 2013-07-03 NOTE — Progress Notes (Signed)
   Subjective:    Patient ID: Carla Alexander, female    DOB: 03/20/1971, 42 y.o.   MRN: [id number]  HPI  Annual exam: Patient has no major complaints. She still has concerns for future DVTs. She does admit to  itchy watery eyes and runny nose. Along with some sneezing. Otherwise she has no complaints.  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 Patient taking xarelto 15 mg daily. She has no history of kidney or liver disease. Creatinine on prior exams last year have been normal. She denies any redness or swelling in either extremity. She denies tenderness in her leg. She doesn't sometimes her legs are sore but nothing current. Denies shortness of breath or chest pain.   Health maintenance: - PAP due 03/2014. No abnormal Paps. Patient reports hysterectomy in approximately 2010. This appears to be a supracervical hysterectomy. Mammogram suggested yearly. Last mammogram 2013. Patient did have abnormal mammogram and in further studies resulted normal. She declines flu shot today. She is due for her tetanus today.    Review of Systems Negative, with the exception of above mentioned in HPI  Objective:   Physical Exam BP 135/77  Pulse 77  Temp(Src) 99.6 F (37.6 C) (Oral)  Ht 5' 8.5" (1.74 m)  Wt 248 lb (112.492 kg)  BMI 37.16 kg/m2  LMP 11/01/2008 Gen: NAD. And cooperative HEENT: AT. [place]. Bilateral TM visualized and dull in appearance. Bilateral eyes without injections or icterus. MMM. Bilateral nares with erythema. Throat without erythema or exudates.  CV: RRR  Chest: CTAB, no wheeze or crackles Abd: Soft.  NTND. BS present. no Masses palpated.  Ext: No erythema. No edema or swelling. Homans negative. No tenderness.  Skin: No rashes, purpura or petechiae.  Neuro:  Normal gait. PERLA. EOMi. Alert. Grossly intact.

## 2013-07-07 ENCOUNTER — Encounter: Payer: Self-pay | Admitting: Family Medicine

## 2013-07-08 ENCOUNTER — Other Ambulatory Visit: Payer: Self-pay

## 2013-07-08 ENCOUNTER — Other Ambulatory Visit: Payer: Self-pay | Admitting: Family Medicine

## 2013-07-08 DIAGNOSIS — Z1231 Encounter for screening mammogram for malignant neoplasm of breast: Secondary | ICD-10-CM

## 2013-07-17 ENCOUNTER — Encounter: Payer: Self-pay | Admitting: Family Medicine

## 2013-07-17 NOTE — Telephone Encounter (Signed)
Pt was informed that TB  Is only good for a year and instructed to schedule a nurse visit.she voiced understanding. Vanessa Kampf, Lewie Loron

## 2013-07-22 ENCOUNTER — Ambulatory Visit (INDEPENDENT_AMBULATORY_CARE_PROVIDER_SITE_OTHER): Payer: BC Managed Care – PPO | Admitting: *Deleted

## 2013-07-22 DIAGNOSIS — Z111 Encounter for screening for respiratory tuberculosis: Secondary | ICD-10-CM

## 2013-07-22 NOTE — Progress Notes (Signed)
Tuberculin skin test applied to left ventral forearm. Explained how to read the test, measuring induration not just erythema; she will come into office in 48-72 hours to have test read.

## 2013-07-24 ENCOUNTER — Ambulatory Visit (INDEPENDENT_AMBULATORY_CARE_PROVIDER_SITE_OTHER): Payer: BC Managed Care – PPO | Admitting: *Deleted

## 2013-07-24 ENCOUNTER — Encounter: Payer: Self-pay | Admitting: *Deleted

## 2013-07-24 DIAGNOSIS — Z111 Encounter for screening for respiratory tuberculosis: Secondary | ICD-10-CM

## 2013-07-24 LAB — TB SKIN TEST
INDURATION: 0 mm
TB Skin Test: NEGATIVE

## 2013-07-24 NOTE — Addendum Note (Signed)
Addended by: Burna Forts A on: 07/24/2013 05:02 PM   Modules accepted: Level of Service

## 2013-07-25 ENCOUNTER — Ambulatory Visit
Admission: RE | Admit: 2013-07-25 | Discharge: 2013-07-25 | Disposition: A | Discharge status: Home / Self Care | Payer: BC Managed Care – PPO | Source: Ambulatory Visit

## 2013-07-25 DIAGNOSIS — Z1231 Encounter for screening mammogram for malignant neoplasm of breast: Secondary | ICD-10-CM

## 2013-07-26 ENCOUNTER — Encounter: Payer: Self-pay | Admitting: Family Medicine

## 2013-07-26 DIAGNOSIS — N644 Mastodynia: Secondary | ICD-10-CM | POA: Insufficient documentation

## 2013-07-30 ENCOUNTER — Other Ambulatory Visit: Payer: Self-pay | Admitting: Family Medicine

## 2013-07-30 DIAGNOSIS — R928 Other abnormal and inconclusive findings on diagnostic imaging of breast: Secondary | ICD-10-CM

## 2013-08-07 ENCOUNTER — Ambulatory Visit
Admission: RE | Admit: 2013-08-07 | Discharge: 2013-08-07 | Disposition: A | Discharge status: Home / Self Care | Payer: BC Managed Care – PPO | Source: Ambulatory Visit | Attending: *Deleted | Admitting: *Deleted

## 2013-08-07 DIAGNOSIS — R928 Other abnormal and inconclusive findings on diagnostic imaging of breast: Secondary | ICD-10-CM

## 2013-08-23 ENCOUNTER — Encounter: Payer: Self-pay | Admitting: Family Medicine

## 2013-08-26 ENCOUNTER — Telehealth: Payer: Self-pay | Admitting: Family Medicine

## 2013-08-26 NOTE — Telephone Encounter (Signed)
Please call pt and explain to her, that with her multiple DVT formations, It would be best if she stayed on the blood thinner for her procedure. She should call her dentist and find out their thoughts and comfort level performing the procedure on Xarelto. At the most I would not want her off it >24 hours, although I do not know if this would be helpful to the dentist and bleeding risk.  In short she needs to call her dentist and find out what they would like her to do. She can call us back after if she has any further questions. Thanks.

## 2013-08-26 NOTE — Telephone Encounter (Signed)
Spoke with patient and informed her of below. She will follow up with dentist

## 2013-09-06 NOTE — Addendum Note (Signed)
Encounter addended by: Campbell Riches on: 09/06/2013  9:50 AM<BR>     Documentation filed: Orders

## 2013-11-28 ENCOUNTER — Encounter: Payer: Self-pay | Admitting: Family Medicine

## 2013-11-28 ENCOUNTER — Ambulatory Visit (INDEPENDENT_AMBULATORY_CARE_PROVIDER_SITE_OTHER): Payer: BC Managed Care – PPO | Admitting: Family Medicine

## 2013-11-28 VITALS — BP 139/85 | HR 63 | Temp 98.3°F | Ht 68.5 in | Wt 263.5 lb

## 2013-11-28 DIAGNOSIS — J069 Acute upper respiratory infection, unspecified: Secondary | ICD-10-CM

## 2013-11-28 DIAGNOSIS — D649 Anemia, unspecified: Secondary | ICD-10-CM

## 2013-11-28 DIAGNOSIS — B9789 Other viral agents as the cause of diseases classified elsewhere: Secondary | ICD-10-CM

## 2013-11-28 LAB — POCT HEMOGLOBIN: HEMOGLOBIN: 9.9 g/dL — AB (ref 12.2–16.2)

## 2013-11-28 LAB — CBC
HCT: 34.7 % — ABNORMAL LOW (ref 36.0–46.0)
HEMOGLOBIN: 11.4 g/dL — AB (ref 12.0–15.0)
MCH: 25.5 pg — ABNORMAL LOW (ref 26.0–34.0)
MCHC: 32.9 g/dL (ref 30.0–36.0)
MCV: 77.6 fL — ABNORMAL LOW (ref 78.0–100.0)
Platelets: 383 10*3/uL (ref 150–400)
RBC: 4.47 MIL/uL (ref 3.87–5.11)
RDW: 14.4 % (ref 11.5–15.5)
WBC: 7.8 10*3/uL (ref 4.0–10.5)

## 2013-11-28 MED ORDER — LORATADINE 10 MG PO TABS: 10.0000 mg | ORAL_TABLET | Freq: Every day | ORAL | Status: DC

## 2013-11-28 MED ORDER — SALINE SPRAY 0.65 % NA SOLN: 1.0000 | NASAL | Status: DC | PRN

## 2013-11-28 MED ORDER — BENZONATATE 200 MG PO CAPS: 200.0000 mg | ORAL_CAPSULE | Freq: Two times a day (BID) | ORAL | Status: DC | PRN

## 2013-11-28 NOTE — Assessment & Plan Note (Addendum)
A: obtained Hgb to r/o cough 2/2 acute blood loss anemia. Down 2 g.  P: CBC Consider iron therapy.   Reviewed CBC: Hgb 11.4 from 12. Microcytic anemia. Suspect iron deficiency. Recommend treat with oral iron 325 mg TID with 4 oz of orange juice/vitamin C tablet followed by repeat CBC in 8 weeks

## 2013-11-28 NOTE — Patient Instructions (Signed)
Carla Alexander,  Thank you for coming in today.  You have a viral URI with cough. For this please do the following:  1. Drink plenty of fluids. Hot tea, soup etc will help open your nasal passages. 2. Tessalon perles for cough suppression 3. Nasal saline.   Call for chest pain, shortness of breath, persistent high fever, facial pain that could suggest bacterial infection.  Dr. Adrian Blackwater

## 2013-11-28 NOTE — Assessment & Plan Note (Signed)
You have a viral URI with cough. For this please do the following:  1. Drink plenty of fluids. Hot tea, soup etc will help open your nasal passages. 2. Tessalon perles for cough suppression 3. Nasal saline.   Call for chest pain, shortness of breath, persistent high fever, facial pain that could suggest bacterial infection.

## 2013-11-28 NOTE — Progress Notes (Signed)
Patient ID: SUSEN HASKEW, female   DOB: January 07, 1971, 43 y.o.   MRN: [id number] Subjective:     CALINDA STOCKINGER is a 43 y.o. female who presents for evaluation of symptoms of a URI. Symptoms include congestion, cough described as nonproductive and sore throat, subjective fever. Onset of symptoms was 7 days ago, and has been unchanged since that time. Treatment to date: antihistamines and decongestants. No know sick contacts.   Soc Hx: non smoker   Review of Systems Pertinent items are noted in HPI.  Denies heavy menses and blood in stools   Objective:    BP 139/85  Pulse 63  Temp(Src) 98.3 F (36.8 C) (Oral)  Ht 5' 8.5" (1.74 m)  Wt 263 lb 8 oz (119.523 kg)  BMI 39.48 kg/m2  LMP 11/01/2008 General appearance: alert, cooperative, no distress and moderately obese Head: Normocephalic, without obvious abnormality, atraumatic Eyes: conjunctivae/corneas clear. PERRL, EOM's intact. Ears: normal TM's and external ear canals both ears Nose: no discharge, turbinates pale, swollen Throat: lips, mucosa, and tongue normal; teeth and gums normal Neck: mild anterior cervical adenopathy Lungs: clear to auscultation bilaterally Heart: regular rate and rhythm, S1, S2 normal, no murmur, click, rub or gallop   Lab Results  Component Value Date   HGB 9.9* 11/28/2013     Assessment:    viral upper respiratory illness   Plan:    Discussed diagnosis and treatment of URI. Suggested symptomatic OTC remedies. Nasal saline spray for congestion. Follow up as needed.

## 2013-11-29 ENCOUNTER — Telehealth: Payer: Self-pay | Admitting: Family Medicine

## 2013-11-29 MED ORDER — FERROUS SULFATE 325 (65 FE) MG PO TABS: 325.0000 mg | ORAL_TABLET | Freq: Three times a day (TID) | ORAL | Status: DC

## 2013-11-29 NOTE — Telephone Encounter (Signed)
Called left Vm.  Wet prep confirmed BV.   Hgb 11.4 from 12. Microcytic anemia. Suspect iron deficiency. Recommend treat with oral iron 325 mg TID with small meal and 4 oz of orange juice/vitamin C tablet followed by repeat CBC in 8 weeks   Awaiting GC/chlam will have CMA call with results.

## 2013-11-29 NOTE — Addendum Note (Signed)
Addended by: Boykin Nearing on: 11/29/2013 09:49 AM   Modules accepted: Orders

## 2013-12-11 ENCOUNTER — Ambulatory Visit (INDEPENDENT_AMBULATORY_CARE_PROVIDER_SITE_OTHER): Payer: BC Managed Care – PPO | Admitting: Family Medicine

## 2013-12-11 ENCOUNTER — Encounter: Payer: Self-pay | Admitting: Family Medicine

## 2013-12-11 VITALS — BP 107/75 | HR 65 | Temp 98.6°F | Ht 68.5 in | Wt 259.0 lb

## 2013-12-11 DIAGNOSIS — E559 Vitamin D deficiency, unspecified: Secondary | ICD-10-CM

## 2013-12-11 DIAGNOSIS — R946 Abnormal results of thyroid function studies: Secondary | ICD-10-CM

## 2013-12-11 DIAGNOSIS — R5383 Other fatigue: Secondary | ICD-10-CM

## 2013-12-11 DIAGNOSIS — R5381 Other malaise: Secondary | ICD-10-CM

## 2013-12-11 DIAGNOSIS — R7989 Other specified abnormal findings of blood chemistry: Secondary | ICD-10-CM | POA: Insufficient documentation

## 2013-12-11 MED ORDER — VITAMIN D (ERGOCALCIFEROL) 1.25 MG (50000 UNIT) PO CAPS: 50000.0000 [IU] | ORAL_CAPSULE | ORAL | Status: DC

## 2013-12-11 NOTE — Assessment & Plan Note (Signed)
Patient with low vitamin D at labs collected had weight loss clinic. Supplement with 50,000 units weekly for 8 weeks. Retest between 8 and 10 weeks.  TSH mildly hyperthyroid a 0.443 and a weight loss clinic last month. We'll repeat lab in 8-10 weeks.  Patient mildly iron deficient, currently on supplementation 3 times a day. Will recheck CBC in 8-10 weeks.

## 2013-12-11 NOTE — Progress Notes (Signed)
   Subjective:    Patient ID: Carla Alexander, female    DOB: [birth year], 43 y.o.   MRN: [id number]  HPI Carla Alexander is a 43 y.o. African American female presents to family medicine clinic today for review of lab work she had at her weight loss clinic.  Irregular labs: Patient states that she had lab work done at her weight loss clinic prior to starting her phentermine medication. She was told that her thyroid function was abnormal by her lab work. Review of her lab work today shows that her thyroid function was very mildly hyperthyroid 0.443. Of note she did have a very low vitamin D level of 11. She also mildly anemic, which appears to be iron deficiency. She is taking iron supplementation 3 times a day. She complains of fatigue. She is taking over-the-counter vitamin D. She denies constipation or dry skin.  Review of Systems Negative, with the exception of above mentioned in HPI     Objective:   Physical Exam BP 107/75  Pulse 65  Temp(Src) 98.6 F (37 C) (Oral)  Ht 5' 8.5" (1.74 m)  Wt 259 lb (117.482 kg)  BMI 38.80 kg/m2  LMP 11/01/2008 Gen: Obese, pleasant, well-developed, nontoxic in appearance in no acute distress. CV: RRR, no murmurs clicks gallops or rubs Chest: CTAB, no wheeze or crackles Abd: Soft. Morbidly obese NTND. BS present. No Masses palpated.  Skin: Warm and dry

## 2013-12-11 NOTE — Patient Instructions (Signed)
Vitamin D Deficiency  Vitamin D is an important vitamin that your body needs. Having too little of it in your body is called a deficiency. A very bad deficiency can make your bones soft and can cause a condition called rickets.   Vitamin D is important to your body for different reasons, such as:   · It helps your body absorb 2 minerals called calcium and phosphorus.  · It helps make your bones healthy.  · It may prevent some diseases, such as diabetes and multiple sclerosis.  · It helps your muscles and heart.  You can get vitamin D in several ways. It is a natural part of some foods. The vitamin is also added to some dairy products and cereals. Some people take vitamin D supplements. Also, your body makes vitamin D when you are in the sun. It changes the sun's rays into a form of the vitamin that your body can use.  CAUSES   · Not eating enough foods that contain vitamin D.  · Not getting enough sunlight.  · Having certain digestive system diseases that make it hard to absorb vitamin D. These diseases include Crohn's disease, chronic pancreatitis, and cystic fibrosis.  · Having a surgery in which part of the stomach or small intestine is removed.  · Being obese. Fat cells pull vitamin D out of your blood. That means that obese people may not have enough vitamin D left in their blood and in other body tissues.  · Having chronic kidney or liver disease.  RISK FACTORS  Risk factors are things that make you more likely to develop a vitamin D deficiency. They include:  · Being older.  · Not being able to get outside very much.  · Living in a nursing home.  · Having had broken bones.  · Having weak or thin bones (osteoporosis).  · Having a disease or condition that changes how your body absorbs vitamin D.  · Having dark skin.  · Some medicines such as seizure medicines or steroids.  · Being overweight or obese.  SYMPTOMS  Mild cases of vitamin D deficiency may not have any symptoms. If you have a very bad case, symptoms  may include:  · Bone pain.  · Muscle pain.  · Falling often.  · Broken bones caused by a minor injury, due to osteoporosis.  DIAGNOSIS  A blood test is the best way to tell if you have a vitamin D deficiency.  TREATMENT  Vitamin D deficiency can be treated in different ways. Treatment for vitamin D deficiency depends on what is causing it. Options include:  · Taking vitamin D supplements.  · Taking a calcium supplement. Your caregiver will suggest what dose is best for you.  HOME CARE INSTRUCTIONS  · Take any supplements that your caregiver prescribes. Follow the directions carefully. Take only the suggested amount.  · Have your blood tested 2 months after you start taking supplements.  · Eat foods that contain vitamin D. Healthy choices include:  · Fortified dairy products, cereals, or juices. Fortified means vitamin D has been added to the food. Check the label on the package to be sure.  · Fatty fish like salmon or trout.  · Eggs.  · Oysters.  · Do not use a tanning bed.  · Keep your weight at a healthy level. Lose weight if you need to.  · Keep all follow-up appointments. Your caregiver will need to perform blood tests to make sure your vitamin D deficiency   is going away.  SEEK MEDICAL CARE IF:  · You have any questions about your treatment.  · You continue to have symptoms of vitamin D deficiency.  · You have nausea or vomiting.  · You are constipated.  · You feel confused.  · You have severe abdominal or back pain.  MAKE SURE YOU:  · Understand these instructions.  · Will watch your condition.  · Will get help right away if you are not doing well or get worse.  Document Released: 09/12/2011 Document Revised: 10/15/2012 Document Reviewed: 09/12/2011  [employer]® Patient Information ©2014 [employer].

## 2014-01-30 ENCOUNTER — Other Ambulatory Visit: Payer: Self-pay | Admitting: Family Medicine

## 2014-04-28 ENCOUNTER — Ambulatory Visit (INDEPENDENT_AMBULATORY_CARE_PROVIDER_SITE_OTHER): Payer: BC Managed Care – PPO | Admitting: Family Medicine

## 2014-04-28 ENCOUNTER — Encounter: Payer: Self-pay | Admitting: Family Medicine

## 2014-04-28 VITALS — BP 116/79 | HR 71 | Temp 98.3°F | Wt 238.0 lb

## 2014-04-28 DIAGNOSIS — H02849 Edema of unspecified eye, unspecified eyelid: Secondary | ICD-10-CM

## 2014-04-28 DIAGNOSIS — L259 Unspecified contact dermatitis, unspecified cause: Secondary | ICD-10-CM | POA: Diagnosis not present

## 2014-04-28 MED ORDER — PREDNISONE 50 MG PO TABS: 50.0000 mg | ORAL_TABLET | Freq: Every day | ORAL | Status: DC

## 2014-04-28 NOTE — Patient Instructions (Signed)
Take prednisone 50mg  daily for 5 days. Return if worsening, or if you have any trouble seeing. Avoid this type of glue in the future.  Be well, Dr. Ardelia Mems

## 2014-04-28 NOTE — Progress Notes (Signed)
Patient ID: Carla Alexander, female   DOB: July 03, 1971, 43 y.o.   MRN: [id number]  HPI:  Pt presents for a same day appointment to discuss allergic rxn.  Got false eyelashes put on on Friday. Normally goes to a different place to get her lashes done each month. Had burning feeling in eyes after getting them done, then started having itching and swelling. Took pictures of it. Got back into town last night and pulled off her R eyelash. Went back there this morning and got her money back. Saw the kind of glue that she uses and apparently this type of glue causes reactions frequently. Vision is fine, unaffected. Feels funny/dizzy/nose hurting. Still itches, not quite as bad. Removed L eye lashes this morning. No purulent drainage. Overall feels better but still different. Not allergic to anything that she knows of.  ROS: See HPI  [place]: hx DVT, anemia, vit D deficiency  PHYSICAL EXAM: BP 116/79  Pulse 71  Temp(Src) 98.3 F (36.8 C) (Oral)  Wt 238 lb (107.956 kg)  LMP 11/01/2008 Gen: NAD HEENT: NCAT. Bilateral upper eyelids with some redness and swelling. No drainage. EOMI. Sclera clear without perilimbal redness. PERRL. Face symmetric Neuro: grossly nonfocal, speech normal  ASSESSMENT/PLAN:  1. Contact dermatitis - suspect contact dermatitis reaction to new false eyelash glue. Visual acuity normal today. Will start prednisone 50mg  daily x 5 days to alleviate irritation and swelling. Return if worsening or vision changes. Avoid this glue in the future.   FOLLOW UP: F/u as needed if symptoms worsen or do not improve.   [place]. Ardelia Mems, [place]

## 2014-05-05 ENCOUNTER — Encounter: Payer: Self-pay | Admitting: Family Medicine

## 2014-06-25 ENCOUNTER — Telehealth: Payer: Self-pay | Admitting: *Deleted

## 2014-06-25 NOTE — Telephone Encounter (Signed)
Patient has appointment for CPE on 1/7 with PCP. Patient is requesting that order be put in for a left lower extremity venous duplex for her to have done prior to this appointment so that MD will already have results. She states she gets one every year due to her history of DVT's.

## 2014-06-27 ENCOUNTER — Other Ambulatory Visit: Payer: Self-pay | Admitting: Family Medicine

## 2014-06-30 ENCOUNTER — Telehealth: Payer: Self-pay | Admitting: Family Medicine

## 2014-06-30 NOTE — Telephone Encounter (Signed)
Please call pt, a yearly venous doppler study is not indicated for monitoring a DVT, so she does not need that completed prior to her appointment. She is on the appropriate medication and dose.  I am looking forward to seeing her this week and we discuss further at that time if she has questions. Thanks.

## 2014-07-01 NOTE — Telephone Encounter (Signed)
Spoke with patient and informed her of below. She still insists on getting doppler. She states that every time she has one preformed something is found. I informed her that she would have to come in and it would have to be documented in an OV before I can schedule it. Also patient was informed that if she is not having symptoms insurance may not pay for it, she stated that they will and she will discuss it at the visit.

## 2014-07-03 ENCOUNTER — Other Ambulatory Visit: Payer: Self-pay

## 2014-07-03 DIAGNOSIS — Z1231 Encounter for screening mammogram for malignant neoplasm of breast: Secondary | ICD-10-CM

## 2014-07-10 ENCOUNTER — Ambulatory Visit (INDEPENDENT_AMBULATORY_CARE_PROVIDER_SITE_OTHER): Payer: BC Managed Care – PPO | Admitting: Family Medicine

## 2014-07-10 ENCOUNTER — Encounter: Payer: Self-pay | Admitting: Family Medicine

## 2014-07-10 VITALS — BP 119/75 | HR 65 | Temp 98.1°F | Ht 69.0 in | Wt 241.5 lb

## 2014-07-10 DIAGNOSIS — Z0000 Encounter for general adult medical examination without abnormal findings: Secondary | ICD-10-CM

## 2014-07-10 DIAGNOSIS — I82409 Acute embolism and thrombosis of unspecified deep veins of unspecified lower extremity: Secondary | ICD-10-CM

## 2014-07-10 DIAGNOSIS — R7989 Other specified abnormal findings of blood chemistry: Secondary | ICD-10-CM | POA: Diagnosis not present

## 2014-07-10 DIAGNOSIS — D649 Anemia, unspecified: Secondary | ICD-10-CM

## 2014-07-10 LAB — CBC WITH DIFFERENTIAL/PLATELET
BASOS PCT: 1 % (ref 0–1)
Basophils Absolute: 0.1 10*3/uL (ref 0.0–0.1)
EOS PCT: 2 % (ref 0–5)
Eosinophils Absolute: 0.2 10*3/uL (ref 0.0–0.7)
HCT: 35.3 % — ABNORMAL LOW (ref 36.0–46.0)
Hemoglobin: 11.6 g/dL — ABNORMAL LOW (ref 12.0–15.0)
LYMPHS PCT: 47 % — AB (ref 12–46)
Lymphs Abs: 3.5 10*3/uL (ref 0.7–4.0)
MCH: 25.6 pg — ABNORMAL LOW (ref 26.0–34.0)
MCHC: 32.9 g/dL (ref 30.0–36.0)
MCV: 77.9 fL — ABNORMAL LOW (ref 78.0–100.0)
MPV: 9.8 fL (ref 8.6–12.4)
Monocytes Absolute: 0.5 10*3/uL (ref 0.1–1.0)
Monocytes Relative: 7 % (ref 3–12)
NEUTROS PCT: 43 % (ref 43–77)
Neutro Abs: 3.2 10*3/uL (ref 1.7–7.7)
Platelets: 360 10*3/uL (ref 150–400)
RBC: 4.53 MIL/uL (ref 3.87–5.11)
RDW: 15.2 % (ref 11.5–15.5)
WBC: 7.5 10*3/uL (ref 4.0–10.5)

## 2014-07-10 LAB — COMPREHENSIVE METABOLIC PANEL
ALT: 17 U/L (ref 0–35)
AST: 22 U/L (ref 0–37)
Albumin: 4.2 g/dL (ref 3.5–5.2)
Alkaline Phosphatase: 59 U/L (ref 39–117)
BILIRUBIN TOTAL: 0.5 mg/dL (ref 0.2–1.2)
BUN: 10 mg/dL (ref 6–23)
CHLORIDE: 101 meq/L (ref 96–112)
CO2: 30 mEq/L (ref 19–32)
Calcium: 9.9 mg/dL (ref 8.4–10.5)
Creat: 0.82 mg/dL (ref 0.50–1.10)
Glucose, Bld: 77 mg/dL (ref 70–99)
Potassium: 4.8 mEq/L (ref 3.5–5.3)
Sodium: 136 mEq/L (ref 135–145)
Total Protein: 7.1 g/dL (ref 6.0–8.3)

## 2014-07-10 LAB — TSH: TSH: 0.94 u[IU]/mL (ref 0.350–4.500)

## 2014-07-10 NOTE — Assessment & Plan Note (Signed)
Patient declines flu shot today, tetanus shot is up-to-date. Her exam is scheduled for the end of this month. Patient does not have a cervix by exam today.

## 2014-07-10 NOTE — Assessment & Plan Note (Signed)
She has a history of mildly elevated TSH, thyroid feels full today.  will patient came in for repeat labs, will obtain TSH today.

## 2014-07-10 NOTE — Progress Notes (Signed)
   Subjective:    Patient ID: Carla Alexander, female    DOB: 1971-03-10, 44 y.o.   MRN: [id number]  HPI  Carla Alexander is a 44 y.o. female presents to family medicine clinic for her annual exam.  Patient states overall she is doing well. She has no complaints today. We concern is her history of DVT, and wanting to have imaging studies for her legs. She states she is completely asymptomatic, has not have any fevers, leg pain swelling or tenderness. She is compliant with her Xarelto 20 mg daily.  She states she thinks she is due for Pap smear, it is noticed that she's had a hysterectomy in the chart. She thinks she does still have her cervix though. She denies any vaginal bleeding, discharge or irritation.  Maintenance: Patient declines the flu shot today. She is up-to-date on her tetanus shot. Her mammogram is due later this month and she has it scheduled. Was no family history of breast or colon cancer to her knowledge. She denies any bowel changes.  Nonsmoker  Past Medical History  Diagnosis Date  . DVT (deep venous thrombosis)   . Fibroid   . Anxiety    No Known Allergies   Review of Systems Per history of present illness    Objective:   Physical Exam BP 119/75 mmHg  Pulse 65  Temp(Src) 98.1 F (36.7 C) (Oral)  Ht 5\' 9"  (1.753 m)  Wt 241 lb 8 oz (109.544 kg)  BMI 35.65 kg/m2  LMP 11/01/2008 Gen: African-American female, no acute distress, non-toxic in appearance, mildly obese. alert, oriented 3. HEENT: AT. Bilateral eyes without injections or icterus. MMM. CV: RRR no murmurs clicks gallops or rubs Chest: CTAB, no wheeze or crackles Abd: Soft. Round. NTND. BS present. No Masses palpated.  Ext: No erythema. No edema. No tenderness. Negative [place]. [place] 2/4 PT/DP   GYN:  External genitalia within normal limits.  Vaginal mucosa pink, moist, normal rugae.  Absent cervix and uterus. No adnexal masses bilaterally.       Assessment & Plan:

## 2014-07-10 NOTE — Patient Instructions (Signed)
We will draw some lab work for me today, I will call you with all the results once they become available. All of your vital signs good today.

## 2014-07-10 NOTE — Assessment & Plan Note (Signed)
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 Discussed with patient today that it is not indicated to scan her legs, unless she is symptomatic. She is not symptomatic today and her exam is normal. Patient states understanding Continue Xarelto 20 mg daily

## 2014-07-10 NOTE — Assessment & Plan Note (Signed)
Patient with history of anemia, she is no longer taking her iron supplementation. CBC collected today.

## 2014-07-11 ENCOUNTER — Encounter: Payer: Self-pay | Admitting: Family Medicine

## 2014-07-28 ENCOUNTER — Encounter: Payer: Self-pay | Admitting: Family Medicine

## 2014-07-28 ENCOUNTER — Ambulatory Visit
Admission: RE | Admit: 2014-07-28 | Discharge: 2014-07-28 | Disposition: A | Discharge status: Home / Self Care | Payer: BC Managed Care – PPO | Source: Ambulatory Visit

## 2014-07-28 DIAGNOSIS — Z1231 Encounter for screening mammogram for malignant neoplasm of breast: Secondary | ICD-10-CM

## 2014-07-28 DIAGNOSIS — R928 Other abnormal and inconclusive findings on diagnostic imaging of breast: Secondary | ICD-10-CM | POA: Insufficient documentation

## 2014-07-29 ENCOUNTER — Other Ambulatory Visit: Payer: Self-pay | Admitting: Family Medicine

## 2014-07-29 DIAGNOSIS — R928 Other abnormal and inconclusive findings on diagnostic imaging of breast: Secondary | ICD-10-CM

## 2014-08-05 ENCOUNTER — Other Ambulatory Visit: Payer: BC Managed Care – PPO

## 2014-08-07 ENCOUNTER — Encounter: Payer: Self-pay | Admitting: Family Medicine

## 2014-08-07 ENCOUNTER — Ambulatory Visit
Admission: RE | Admit: 2014-08-07 | Discharge: 2014-08-07 | Disposition: A | Discharge status: Home / Self Care | Payer: BC Managed Care – PPO | Source: Ambulatory Visit | Attending: Family Medicine | Admitting: Family Medicine

## 2014-08-07 DIAGNOSIS — N6320 Unspecified lump in the left breast, unspecified quadrant: Secondary | ICD-10-CM | POA: Insufficient documentation

## 2014-08-07 DIAGNOSIS — R928 Other abnormal and inconclusive findings on diagnostic imaging of breast: Secondary | ICD-10-CM

## 2014-08-08 ENCOUNTER — Other Ambulatory Visit: Payer: Self-pay | Admitting: Family Medicine

## 2014-08-08 DIAGNOSIS — N63 Unspecified lump in unspecified breast: Secondary | ICD-10-CM

## 2014-08-19 ENCOUNTER — Other Ambulatory Visit: Payer: Self-pay | Admitting: Family Medicine

## 2014-08-19 DIAGNOSIS — N63 Unspecified lump in unspecified breast: Secondary | ICD-10-CM

## 2014-08-20 ENCOUNTER — Ambulatory Visit
Admission: RE | Admit: 2014-08-20 | Discharge: 2014-08-20 | Disposition: A | Discharge status: Home / Self Care | Payer: BC Managed Care – PPO | Source: Ambulatory Visit | Attending: Family Medicine | Admitting: Family Medicine

## 2014-08-20 DIAGNOSIS — N63 Unspecified lump in unspecified breast: Secondary | ICD-10-CM

## 2015-04-01 ENCOUNTER — Ambulatory Visit (INDEPENDENT_AMBULATORY_CARE_PROVIDER_SITE_OTHER): Payer: BC Managed Care – PPO | Admitting: *Deleted

## 2015-04-01 DIAGNOSIS — Z111 Encounter for screening for respiratory tuberculosis: Secondary | ICD-10-CM

## 2015-04-03 ENCOUNTER — Ambulatory Visit (INDEPENDENT_AMBULATORY_CARE_PROVIDER_SITE_OTHER): Payer: BC Managed Care – PPO | Admitting: *Deleted

## 2015-04-03 ENCOUNTER — Encounter: Payer: Self-pay | Admitting: *Deleted

## 2015-04-03 DIAGNOSIS — Z7689 Persons encountering health services in other specified circumstances: Secondary | ICD-10-CM

## 2015-04-03 DIAGNOSIS — Z111 Encounter for screening for respiratory tuberculosis: Secondary | ICD-10-CM

## 2015-04-03 LAB — TB SKIN TEST
Induration: 0 mm
TB Skin Test: NEGATIVE

## 2015-04-03 NOTE — Progress Notes (Signed)
   PPD Reading Note PPD read and results entered in [employer]. Result: 0 mm induration. Interpretation: Negative If test not read within 48-72 hours of initial placement, patient advised to repeat in other arm 1-3 weeks after this test. Allergic reaction: no  Martin, Tamika L, RN  

## 2015-04-09 ENCOUNTER — Encounter: Payer: Self-pay | Admitting: Family Medicine

## 2015-06-01 ENCOUNTER — Encounter: Payer: Self-pay | Admitting: Family Medicine

## 2015-07-03 ENCOUNTER — Other Ambulatory Visit: Payer: Self-pay

## 2015-07-03 DIAGNOSIS — Z1231 Encounter for screening mammogram for malignant neoplasm of breast: Secondary | ICD-10-CM

## 2015-07-08 ENCOUNTER — Other Ambulatory Visit: Payer: Self-pay | Admitting: *Deleted

## 2015-07-08 MED ORDER — RIVAROXABAN 20 MG PO TABS: ORAL_TABLET | ORAL | Status: DC

## 2015-07-13 ENCOUNTER — Encounter: Payer: BC Managed Care – PPO | Admitting: Family Medicine

## 2015-07-20 ENCOUNTER — Encounter: Payer: Self-pay | Admitting: Family Medicine

## 2015-07-20 ENCOUNTER — Ambulatory Visit (INDEPENDENT_AMBULATORY_CARE_PROVIDER_SITE_OTHER): Payer: BC Managed Care – PPO | Admitting: Family Medicine

## 2015-07-20 VITALS — BP 144/85 | HR 73 | Temp 98.8°F | Ht 69.0 in | Wt 259.3 lb

## 2015-07-20 DIAGNOSIS — Z0000 Encounter for general adult medical examination without abnormal findings: Secondary | ICD-10-CM

## 2015-07-20 NOTE — Progress Notes (Signed)
   Subjective:    Patient ID: Carla Alexander, female    DOB: 23-Jul-1970, 45 y.o.   MRN: [id number]  HPI  CC: annual  # Annual physical:  Never smoker  Obese, asks about referral for bariatric surgery  Hysterectomy with no prior abnormal pap, discussed not recommended she continue pap smears  HIV screen done "years ago" -- denies new risk and does not want to be screened again  No FH of early breast cancer or colon cancer. Does say her sister just had a biopsy of her thyroid last week ROS: intentional 9lb weight loss, recent headaches and constipation (blames on "soup diet"), no trouble swallowing, no chest pain, no SOB, no abdominal pain, no leg swelling or pain  # Hx of DVT, now on xarelto  Denies any issues  No leg pain or swelling  No chest pain or shortness of breath  No bleeding episodes  Social Hx: never smoker  Review of Systems   See HPI for ROS.   Past medical history, surgical, family, and social history reviewed and updated in the EMR as appropriate. Objective:  BP 144/85 mmHg  Pulse 73  Temp(Src) 98.8 F (37.1 C) (Oral)  Ht 5\' 9"  (1.753 m)  Wt 259 lb 4.8 oz (117.618 kg)  BMI 38.27 kg/m2  LMP 11/01/2008 Vitals and nursing note reviewed  General: NAD Neck: normal thyroid elevation, no nodules noted CV: RRR, normal s1s2, no murmurs, rubs or gallop. 2+ radial, DP pulses bilaterally  Resp: clear to auscultation bilaterally, normal effort Abdomen: soft, nontender, nondistended, no organomegaly, normal bowel sounds Extremities: no LE edema, calf size symmetrically, no calf tenderness Neuro: alert and oriented, no focal deficits, normal gait. Skin: no rashes noted  Assessment & Plan:  Health care maintenance No pap needed (hysterectomy with removed cervix, no prior abnormal). Scheduled for mammogram next week. Discussed repeating cholesterol panel, HIV screen, at this time patient has declined. Discussed diet/exercise counseling for weight loss. BP  slightly elevated in clinic today (first recorded, reviewed flowsheets) and recommended she check at pharmacy and call if continues to stay elevated. Follow up 1 year or sooner if needed.

## 2015-07-20 NOTE — Assessment & Plan Note (Addendum)
No pap needed (hysterectomy with removed cervix, no prior abnormal). Scheduled for mammogram next week. Discussed repeating cholesterol panel, HIV screen, at this time patient has declined. Discussed diet/exercise counseling for weight loss. BP slightly elevated in clinic today (first recorded, reviewed flowsheets) and recommended she check at pharmacy and call if continues to stay elevated. Follow up 1 year or sooner if needed.

## 2015-07-20 NOTE — Patient Instructions (Signed)
Blood pressure a little high today:  Measure at the pharmacy whenever you're there, if it is consistently above 140 over 90, please call the clinic for a visit.

## 2015-07-30 ENCOUNTER — Ambulatory Visit
Admission: RE | Admit: 2015-07-30 | Discharge: 2015-07-30 | Disposition: A | Discharge status: Home / Self Care | Payer: BC Managed Care – PPO | Source: Ambulatory Visit

## 2015-07-30 DIAGNOSIS — Z1231 Encounter for screening mammogram for malignant neoplasm of breast: Secondary | ICD-10-CM

## 2015-09-06 ENCOUNTER — Emergency Department (HOSPITAL_COMMUNITY)
Admission: EM | Admit: 2015-09-06 | Discharge: 2015-09-06 | Disposition: A | Discharge status: Home / Self Care | Payer: BC Managed Care – PPO | Attending: Emergency Medicine | Admitting: Emergency Medicine

## 2015-09-06 ENCOUNTER — Encounter (HOSPITAL_COMMUNITY): Payer: Self-pay

## 2015-09-06 ENCOUNTER — Emergency Department (HOSPITAL_COMMUNITY): Payer: BC Managed Care – PPO

## 2015-09-06 DIAGNOSIS — J3489 Other specified disorders of nose and nasal sinuses: Secondary | ICD-10-CM | POA: Diagnosis not present

## 2015-09-06 DIAGNOSIS — F419 Anxiety disorder, unspecified: Secondary | ICD-10-CM | POA: Insufficient documentation

## 2015-09-06 DIAGNOSIS — M549 Dorsalgia, unspecified: Secondary | ICD-10-CM | POA: Insufficient documentation

## 2015-09-06 DIAGNOSIS — R51 Headache: Secondary | ICD-10-CM | POA: Insufficient documentation

## 2015-09-06 DIAGNOSIS — Z79899 Other long term (current) drug therapy: Secondary | ICD-10-CM | POA: Diagnosis not present

## 2015-09-06 DIAGNOSIS — R0982 Postnasal drip: Secondary | ICD-10-CM | POA: Insufficient documentation

## 2015-09-06 DIAGNOSIS — Z7901 Long term (current) use of anticoagulants: Secondary | ICD-10-CM | POA: Insufficient documentation

## 2015-09-06 DIAGNOSIS — R1110 Vomiting, unspecified: Secondary | ICD-10-CM | POA: Insufficient documentation

## 2015-09-06 DIAGNOSIS — Z86718 Personal history of other venous thrombosis and embolism: Secondary | ICD-10-CM | POA: Diagnosis not present

## 2015-09-06 DIAGNOSIS — Z86018 Personal history of other benign neoplasm: Secondary | ICD-10-CM | POA: Diagnosis not present

## 2015-09-06 DIAGNOSIS — R079 Chest pain, unspecified: Secondary | ICD-10-CM | POA: Insufficient documentation

## 2015-09-06 DIAGNOSIS — R6889 Other general symptoms and signs: Secondary | ICD-10-CM

## 2015-09-06 LAB — COMPREHENSIVE METABOLIC PANEL
ALT: 17 U/L (ref 14–54)
ANION GAP: 11 (ref 5–15)
AST: 26 U/L (ref 15–41)
Albumin: 3.6 g/dL (ref 3.5–5.0)
Alkaline Phosphatase: 60 U/L (ref 38–126)
BUN: 11 mg/dL (ref 6–20)
CHLORIDE: 103 mmol/L (ref 101–111)
CO2: 24 mmol/L (ref 22–32)
Calcium: 9.5 mg/dL (ref 8.9–10.3)
Creatinine, Ser: 0.86 mg/dL (ref 0.44–1.00)
Glucose, Bld: 103 mg/dL — ABNORMAL HIGH (ref 65–99)
Potassium: 4.2 mmol/L (ref 3.5–5.1)
SODIUM: 138 mmol/L (ref 135–145)
TOTAL PROTEIN: 7.3 g/dL (ref 6.5–8.1)
Total Bilirubin: 0.5 mg/dL (ref 0.3–1.2)

## 2015-09-06 LAB — URINALYSIS, ROUTINE W REFLEX MICROSCOPIC
BILIRUBIN URINE: NEGATIVE
GLUCOSE, UA: NEGATIVE mg/dL
KETONES UR: NEGATIVE mg/dL
Leukocytes, UA: NEGATIVE
Nitrite: NEGATIVE
PH: 5.5 (ref 5.0–8.0)
Protein, ur: NEGATIVE mg/dL
Specific Gravity, Urine: 1.012 (ref 1.005–1.030)

## 2015-09-06 LAB — PROTIME-INR
INR: 1.45 (ref 0.00–1.49)
PROTHROMBIN TIME: 17.8 s — AB (ref 11.6–15.2)

## 2015-09-06 LAB — CBC WITH DIFFERENTIAL/PLATELET
Basophils Absolute: 0 10*3/uL (ref 0.0–0.1)
Basophils Relative: 0 %
EOS ABS: 0.1 10*3/uL (ref 0.0–0.7)
Eosinophils Relative: 1 %
HCT: 36.9 % (ref 36.0–46.0)
Hemoglobin: 12.1 g/dL (ref 12.0–15.0)
Lymphocytes Relative: 46 %
Lymphs Abs: 3.2 10*3/uL (ref 0.7–4.0)
MCH: 26.1 pg (ref 26.0–34.0)
MCHC: 32.8 g/dL (ref 30.0–36.0)
MCV: 79.7 fL (ref 78.0–100.0)
MONOS PCT: 14 %
Monocytes Absolute: 1 10*3/uL (ref 0.1–1.0)
Neutro Abs: 2.8 10*3/uL (ref 1.7–7.7)
Neutrophils Relative %: 39 %
PLATELETS: 303 10*3/uL (ref 150–400)
RBC: 4.63 MIL/uL (ref 3.87–5.11)
RDW: 13.8 % (ref 11.5–15.5)
WBC: 7.1 10*3/uL (ref 4.0–10.5)

## 2015-09-06 LAB — URINE MICROSCOPIC-ADD ON: WBC, UA: NONE SEEN WBC/hpf (ref 0–5)

## 2015-09-06 LAB — I-STAT TROPONIN, ED: Troponin i, poc: 0 ng/mL (ref 0.00–0.08)

## 2015-09-06 MED ORDER — TRAMADOL HCL 50 MG PO TABS: 50.0000 mg | ORAL_TABLET | Freq: Four times a day (QID) | ORAL | Status: DC | PRN

## 2015-09-06 MED ORDER — KETOROLAC TROMETHAMINE 30 MG/ML IJ SOLN
30.0000 mg | Freq: Once | INTRAMUSCULAR | Status: AC
  Administered 2015-09-06: 30 mg via INTRAVENOUS
  Filled 2015-09-06: qty 1

## 2015-09-06 MED ORDER — OXYMETAZOLINE HCL 0.05 % NA SOLN: 1.0000 | Freq: Two times a day (BID) | NASAL | Status: DC

## 2015-09-06 MED ORDER — SODIUM CHLORIDE 0.9 % IV BOLUS (SEPSIS)
1000.0000 mL | Freq: Once | INTRAVENOUS | Status: AC
  Administered 2015-09-06: 1000 mL via INTRAVENOUS

## 2015-09-06 MED ORDER — ONDANSETRON HCL 4 MG/2ML IJ SOLN
4.0000 mg | Freq: Once | INTRAMUSCULAR | Status: AC
  Administered 2015-09-06: 4 mg via INTRAVENOUS
  Filled 2015-09-06: qty 2

## 2015-09-06 MED ORDER — FENTANYL CITRATE (PF) 100 MCG/2ML IJ SOLN
100.0000 ug | Freq: Once | INTRAMUSCULAR | Status: AC
  Administered 2015-09-06: 100 ug via INTRAVENOUS
  Filled 2015-09-06: qty 2

## 2015-09-06 NOTE — ED Notes (Signed)
Called [facility] to get update on i-stat trop, stated its currently in process

## 2015-09-06 NOTE — ED Notes (Signed)
EDP at bedside  

## 2015-09-06 NOTE — ED Notes (Signed)
Pt reports onset 4 days post nasal drip and sinus pressure over nose and forehead, lower back pain x 2 days, nausea x 2 days.  NO vomiting, urinary symptoms..  Not sure if running fever.  No respiatory or swallowing difficulties.

## 2015-09-06 NOTE — ED Provider Notes (Signed)
CSN: [id number]     Arrival date & time 09/06/15  1754 History   First MD Initiated Contact with Patient 09/06/15 2003     Chief Complaint  Patient presents with  . Back Pain  . Nausea  . Facial Pain     (Consider location/radiation/quality/duration/timing/severity/associated sxs/prior Treatment) HPI   Patient has a PMH of DVT, fibroid and anxiety. She comes to the ER for multiple complaints including myalgias, back pains, pain to chest with deep breath, nausea, post nasal drip, sinus pressure, forehead pain. She has not had any nausea, vomiting or diarrhea. Denies any urinary symptoms or vaginal symptoms. Denies having fever, coughing, syncope, headache, dysphagia, abdominal pain, LE swelling, neck pain, change in vision. Her symptoms have been persisting for 4 days and she decided to come in to the ER today because she is feeling worse. Has been around sick contacts with flu, denies any recent accidents or increased activities.  PCP: Tawanna Sat, MD  Carla Alexander is a 45 y.o.  female   Past Medical History  Diagnosis Date  . DVT (deep venous thrombosis) ([place])   . Fibroid   . Anxiety    Past Surgical History  Procedure Laterality Date  . Tubal ligation    . Vaginal hysterectomy  2010    [facility]   Family History  Problem Relation Age of Onset  . Heart disease Mother   . Diabetes Mother   . Stroke Father   . Diabetes Sister   . Heart disease Sister   . Heart disease Maternal Grandfather   . Diabetes Maternal Grandfather   . Cancer Maternal Grandfather     Colon  . Diabetes Maternal Grandmother   . Cancer Maternal Grandmother     Unknown type   Social History  Substance Use Topics  . Smoking status: Never Smoker   . Smokeless tobacco: Never Used  . Alcohol Use: No   OB History    Gravida Para Term Preterm AB TAB SAB Ectopic Multiple Living   2 2 2       2      Review of Systems  Review of Systems All other systems negative except as documented in the HPI.  All pertinent positives and negatives as reviewed in the HPI.   Allergies  Review of patient's allergies indicates no known allergies.  Home Medications   Prior to Admission medications   Medication Sig Start Date End Date Taking? Authorizing Provider  acetaminophen (TYLENOL) 500 MG tablet Take 1,000 mg by mouth every 6 (six) hours as needed for fever (pain).   Yes Historical Provider, MD  rivaroxaban (XARELTO) 20 MG TABS tablet TAKE 1 TABLET (20 MG TOTAL) BY MOUTH DAILY WITH SUPPER. Patient taking differently: Take 20 mg by mouth daily with supper.  07/08/15  Yes Leone Brand, MD  oxymetazoline [facility] NASAL SPRAY) 0.05 % nasal spray Place 1 spray into both nostrils 2 (two) times daily. 09/06/15   Karima Carrell Carlota Raspberry, PA-C  traMADol (ULTRAM) 50 MG tablet Take 1 tablet (50 mg total) by mouth every 6 (six) hours as needed. 09/06/15   Delos Haring, PA-C  Vitamin D, Ergocalciferol, (DRISDOL) 50000 UNITS CAPS capsule Take 1 capsule (50,000 Units total) by mouth every 7 (seven) days. Patient not taking: Reported on 09/06/2015 12/11/13   Renee A Kuneff, DO   BP 111/83 mmHg  Pulse 65  Temp(Src) 98.9 F (37.2 C)  Resp 27  Ht 5\' 8"  (1.727 m)  Wt 119.115 kg  BMI 39.94 kg/m2  SpO2 99%  LMP 11/01/2008 Physical Exam  Constitutional: She appears well-developed and well-nourished. No distress.  HENT:  Head: Normocephalic and atraumatic.  Right Ear: Tympanic membrane and ear canal normal.  Left Ear: Tympanic membrane and ear canal normal.  Nose: Nose normal.  Mouth/Throat: Uvula is midline, oropharynx is clear and moist and mucous membranes are normal.  Eyes: Pupils are equal, round, and reactive to light.  Neck: Normal range of motion. Neck supple.  Cardiovascular: Normal rate and regular rhythm.   Pulmonary/Chest: Effort normal.  Abdominal: Soft.  No signs of abdominal distention  Musculoskeletal:  No LE swelling Symmetrical and physiologic strength to bilateral lower extremities.  Neurosensory  function adequate to both legs Skin color is normal. Skin is warm and moist.  No step off deformity appreciated and no midline bony tenderness.  Ambulatory  No crepitus, laceration, effusion, induration, lesions Pedal pulses are symmetrical and palpable bilaterally  No tenderness to palpation of back or hips.   Neurological: She is alert.  Acting at baseline  Skin: Skin is warm and dry. No rash noted.  Nursing note and vitals reviewed.   ED Course  Procedures (including critical care time) Labs Review Labs Reviewed  URINALYSIS, ROUTINE W REFLEX MICROSCOPIC (NOT AT [facility]) - Abnormal; Notable for the following:    Hgb urine dipstick TRACE (*)    All other components within normal limits  COMPREHENSIVE METABOLIC PANEL - Abnormal; Notable for the following:    Glucose, Bld 103 (*)    All other components within normal limits  PROTIME-INR - Abnormal; Notable for the following:    Prothrombin Time 17.8 (*)    All other components within normal limits  URINE MICROSCOPIC-ADD ON - Abnormal; Notable for the following:    Squamous Epithelial / LPF 0-5 (*)    Bacteria, UA RARE (*)    All other components within normal limits  CBC WITH DIFFERENTIAL/PLATELET  Randolm Idol, ED    Imaging Review Dg Chest 2 View  09/06/2015  CLINICAL DATA:  Pain with deep breathing. Body aches. Flu-like symptoms. EXAM: CHEST  2 VIEW COMPARISON:  09/12/2011 FINDINGS: The heart size and mediastinal contours are within normal limits. Both lungs are clear. The visualized skeletal structures are unremarkable. IMPRESSION: No active cardiopulmonary disease. Electronically Signed   By: Lucienne Capers M.D.   On: 09/06/2015 21:58   I have personally reviewed and evaluated these images and lab results as part of my medical decision-making.   EKG Interpretation   Date/Time:  Sunday September 06 2015 20:54:39 EST Ventricular Rate:  75 PR Interval:  134 QRS Duration: 100 QT Interval:  424 QTC Calculation: 474 R  Axis:   28 Text Interpretation:  Sinus rhythm Abnormal R-wave progression, early  transition Inferior infarct, old nonspecific T wave changes are similar to  2013 Confirmed by GOLDSTON  MD, Ackerly ([id number]) on 09/06/2015 8:58:36 PM      MDM   Final diagnoses:  Flu-like symptoms    Patient with symptoms consistent with influenza.  Vitals are stable, low-grade fever.  No signs of dehydration, tolerating PO's.  Lungs are clear. Due to patient's presentation and physical exam a chest x-ray was not ordered bc likely diagnosis of flu.  Discussed the cost versus benefit of Tamiflu treatment with the patient.  The patient understands that symptoms are greater than the recommended 24-48 hour window of treatment.  Patient will be discharged with instructions to orally hydrate, rest, and use over-the-counter medications such as anti-inflammatories ibuprofen and Aleve for  muscle aches and Tylenol for fever.  Patient will also be given a nasal spray for sinus pressure.  She has been given very strict return precautions and advised to see her PCP on Monday or Tuesday    Delos Haring, PA-C 09/06/15 [id number]  Sherwood Gambler, MD 09/09/15 [phone]

## 2015-09-06 NOTE — Discharge Instructions (Signed)

## 2015-11-05 ENCOUNTER — Encounter: Payer: Self-pay | Admitting: Family Medicine

## 2015-12-30 ENCOUNTER — Encounter: Payer: Self-pay | Admitting: Family Medicine

## 2016-01-01 MED ORDER — CHOLECALCIFEROL 25 MCG (1000 UT) PO CAPS: 1000.0000 [IU] | ORAL_CAPSULE | Freq: Every day | ORAL | Status: DC

## 2016-05-02 ENCOUNTER — Encounter: Payer: Self-pay | Admitting: Family Medicine

## 2016-06-26 ENCOUNTER — Other Ambulatory Visit: Payer: Self-pay | Admitting: Family Medicine

## 2016-07-05 ENCOUNTER — Other Ambulatory Visit: Payer: Self-pay | Admitting: Family Medicine

## 2016-07-05 DIAGNOSIS — Z1231 Encounter for screening mammogram for malignant neoplasm of breast: Secondary | ICD-10-CM

## 2016-07-11 ENCOUNTER — Encounter: Payer: Self-pay | Admitting: Family Medicine

## 2016-07-11 ENCOUNTER — Ambulatory Visit (HOSPITAL_COMMUNITY)
Admission: RE | Admit: 2016-07-11 | Discharge: 2016-07-11 | Disposition: A | Discharge status: Home / Self Care | Payer: BC Managed Care – PPO | Source: Ambulatory Visit | Attending: Family Medicine | Admitting: Family Medicine

## 2016-07-11 ENCOUNTER — Ambulatory Visit (INDEPENDENT_AMBULATORY_CARE_PROVIDER_SITE_OTHER): Payer: BC Managed Care – PPO | Admitting: Family Medicine

## 2016-07-11 VITALS — BP 132/74 | HR 70 | Temp 98.0°F | Ht 69.0 in | Wt 275.0 lb

## 2016-07-11 DIAGNOSIS — M79605 Pain in left leg: Secondary | ICD-10-CM | POA: Insufficient documentation

## 2016-07-11 DIAGNOSIS — Z86718 Personal history of other venous thrombosis and embolism: Secondary | ICD-10-CM | POA: Insufficient documentation

## 2016-07-11 DIAGNOSIS — M79602 Pain in left arm: Secondary | ICD-10-CM | POA: Diagnosis not present

## 2016-07-11 DIAGNOSIS — I824Z2 Acute embolism and thrombosis of unspecified deep veins of left distal lower extremity: Secondary | ICD-10-CM | POA: Diagnosis not present

## 2016-07-11 NOTE — Assessment & Plan Note (Signed)
Clinically no signs of DVT. Patient w/ symptoms since October. On Xarelto since 2013.  -will check Left lower extremity doppler today to rule out DVT -will call patient with results -continue xarelto

## 2016-07-11 NOTE — Progress Notes (Signed)
    Subjective:    Patient ID: Carla Alexander, female    DOB: 1971-02-15, 46 y.o.   MRN: [id number]   CC: concern for DVT  HPI: has hx of DVT, first clot in left leg behind knee in 2012. Was on warfarin. Second clot a year later 2013 in same spot. Was switched to Xarelto. Has been compliant with xarelto. States she never misses a dose. Will take it in the evening after 5 pm.  Since October she has been having pain in her left leg again behind her knee that comes and goes. At its worst is 8/10. Also reports cramps in leg. Acknowledges she has been ignoring it and hoping it would go away on its own. Decided to be seen last week. Denies chest pain, shortness of breath, feeling like her heart is racing.   Also reports left arm pain in her left shoulder to back of left arm. Sometimes her arm "feels heavy" and she is unable to lift it. It will hurt to touch occasionally. She does not have weakness, has not been dropping things. Takes tylenol which helps. Has done stretches which helps. Denies injury to area. No strain to arm, does not exercise.   Smoking status reviewed- non smoker  Review of Systems- see HPI   Objective:  BP 132/74   Pulse 70   Temp 98 F (36.7 C) (Oral)   Ht 5\' 9"  (1.753 m)   Wt 275 lb (124.7 kg)   LMP 11/01/2008   SpO2 98%   BMI 40.61 kg/m  Vitals and nursing note reviewed  General: obese woman, well nourished, in no acute distress Cardiac: RRR, clear S1 and S2, no murmurs, rubs, or gallops Respiratory: clear to auscultation bilaterally, no increased work of breathing Abdomen: soft, nontender, nondistended, no masses or organomegaly. Bowel sounds present Extremities: no edema or cyanosis. Active ROM equal in LEU and RUE. Pain with adduction of left arm. Pain with extension of arm. No swelling of LEU. Tender to palpation over deltoid muscle. No signs of superficial clot in LEU. Left calf and right calf equal in circumference. No signs of swelling, no redness.  Non-tender to palpation of right and left calf. Negative homans sign bilaterally Skin: warm and dry, no rashes noted Neuro: alert and oriented, no focal deficits   Assessment & Plan:    DVT (deep venous thrombosis) Clinically no signs of DVT. Patient w/ symptoms since October. On Xarelto since 2013.  -will check Left lower extremity doppler today to rule out DVT -will call patient with results -continue xarelto  Arm pain, musculoskeletal, left  Pain in left shoulder region consistent with muscle strain. No signs of rotator cuff injury. No weakness. Tender to palpation over deltoid muscle.  -continue tylenol, stretching -patient did not want referral to sports medicine at this time -recommended heating pad -patient interested in beginning to exercise with light weights, encouraged this -follow up if worsens or fails to improve, can consider referral to sports med at that time or [facility] [place]    Return in about 6 months (around 01/08/2017).   Lucila Maine, DO Family Medicine Resident PGY-1

## 2016-07-11 NOTE — Progress Notes (Signed)
VASCULAR LAB PRELIMINARY  PRELIMINARY  PRELIMINARY  PRELIMINARY  Left lower extremity venous duplex completed.    Preliminary report:  Left:  No evidence of DVT, superficial thrombosis, or Baker's cyst.  Ishita Mcnerney, RVS 07/11/2016, 3:49 PM

## 2016-07-11 NOTE — Assessment & Plan Note (Signed)
  Pain in left shoulder region consistent with muscle strain. No signs of rotator cuff injury. No weakness. Tender to palpation over deltoid muscle.  -continue tylenol, stretching -patient did not want referral to sports medicine at this time -recommended heating pad -patient interested in beginning to exercise with light weights, encouraged this -follow up if worsens or fails to improve, can consider referral to sports med at that time or [facility] [place]

## 2016-07-27 ENCOUNTER — Telehealth: Payer: Self-pay | Admitting: Family Medicine

## 2016-07-27 NOTE — Telephone Encounter (Signed)
Pt states she declined PT at last appointment, but has changed her mind and would like a referral. ep

## 2016-07-28 ENCOUNTER — Other Ambulatory Visit: Payer: Self-pay | Admitting: Student in an Organized Health Care Education/Training Program

## 2016-07-28 DIAGNOSIS — M79602 Pain in left arm: Secondary | ICD-10-CM

## 2016-07-28 NOTE — Telephone Encounter (Signed)
I called to inform pt and she stated she has a specific place she would like to go to, however, did not know the name off the top of her head. Pt is to call back and give information.

## 2016-07-28 NOTE — Telephone Encounter (Signed)
I put in a PT referral. Please let patient know she should receive a call to set up an appointment and if she does not receive a call in two weeks she should reach out to our office.  Thanks, Everrett Coombe MD

## 2016-07-28 NOTE — Telephone Encounter (Signed)
Carla Alexander

## 2016-07-28 NOTE — Telephone Encounter (Signed)
I have documented that patient wants to go to [facility] for PT. I will send referral tomorrow. Thanks.

## 2016-08-01 ENCOUNTER — Ambulatory Visit
Admission: RE | Admit: 2016-08-01 | Discharge: 2016-08-01 | Disposition: A | Discharge status: Home / Self Care | Payer: BC Managed Care – PPO | Source: Ambulatory Visit | Attending: Family Medicine | Admitting: Family Medicine

## 2016-08-01 DIAGNOSIS — Z1231 Encounter for screening mammogram for malignant neoplasm of breast: Secondary | ICD-10-CM

## 2016-08-03 ENCOUNTER — Other Ambulatory Visit: Payer: Self-pay | Admitting: Family Medicine

## 2016-08-03 DIAGNOSIS — R928 Other abnormal and inconclusive findings on diagnostic imaging of breast: Secondary | ICD-10-CM

## 2016-08-11 ENCOUNTER — Ambulatory Visit
Admission: RE | Admit: 2016-08-11 | Discharge: 2016-08-11 | Disposition: A | Discharge status: Home / Self Care | Payer: BC Managed Care – PPO | Source: Ambulatory Visit | Attending: Family Medicine | Admitting: Family Medicine

## 2016-08-11 DIAGNOSIS — R928 Other abnormal and inconclusive findings on diagnostic imaging of breast: Secondary | ICD-10-CM

## 2016-10-24 ENCOUNTER — Other Ambulatory Visit: Payer: Self-pay | Admitting: *Deleted

## 2016-10-24 MED ORDER — CHOLECALCIFEROL 25 MCG (1000 UT) PO CAPS: 1000.0000 [IU] | ORAL_CAPSULE | Freq: Every day | ORAL | 3 refills | Status: AC

## 2016-11-01 DIAGNOSIS — K819 Cholecystitis, unspecified: Secondary | ICD-10-CM

## 2016-11-01 HISTORY — DX: Cholecystitis, unspecified: K81.9

## 2016-11-13 ENCOUNTER — Inpatient Hospital Stay (HOSPITAL_COMMUNITY)
Admission: EM | Admit: 2016-11-13 | Discharge: 2016-11-17 | DRG: 419 | Disposition: A | Discharge status: Home / Self Care | Payer: BC Managed Care – PPO | Attending: Family Medicine | Admitting: Family Medicine

## 2016-11-13 ENCOUNTER — Inpatient Hospital Stay (HOSPITAL_COMMUNITY): Payer: BC Managed Care – PPO

## 2016-11-13 ENCOUNTER — Encounter (HOSPITAL_COMMUNITY): Payer: Self-pay | Admitting: Emergency Medicine

## 2016-11-13 ENCOUNTER — Emergency Department (HOSPITAL_COMMUNITY): Payer: BC Managed Care – PPO

## 2016-11-13 DIAGNOSIS — Z823 Family history of stroke: Secondary | ICD-10-CM

## 2016-11-13 DIAGNOSIS — Z90710 Acquired absence of both cervix and uterus: Secondary | ICD-10-CM

## 2016-11-13 DIAGNOSIS — Z7901 Long term (current) use of anticoagulants: Secondary | ICD-10-CM

## 2016-11-13 DIAGNOSIS — Z809 Family history of malignant neoplasm, unspecified: Secondary | ICD-10-CM | POA: Diagnosis not present

## 2016-11-13 DIAGNOSIS — K8050 Calculus of bile duct without cholangitis or cholecystitis without obstruction: Secondary | ICD-10-CM | POA: Diagnosis not present

## 2016-11-13 DIAGNOSIS — K8062 Calculus of gallbladder and bile duct with acute cholecystitis without obstruction: Secondary | ICD-10-CM | POA: Diagnosis present

## 2016-11-13 DIAGNOSIS — E669 Obesity, unspecified: Secondary | ICD-10-CM | POA: Diagnosis present

## 2016-11-13 DIAGNOSIS — Z86718 Personal history of other venous thrombosis and embolism: Secondary | ICD-10-CM | POA: Diagnosis not present

## 2016-11-13 DIAGNOSIS — K810 Acute cholecystitis: Secondary | ICD-10-CM | POA: Diagnosis not present

## 2016-11-13 DIAGNOSIS — R1011 Right upper quadrant pain: Secondary | ICD-10-CM

## 2016-11-13 DIAGNOSIS — Z833 Family history of diabetes mellitus: Secondary | ICD-10-CM | POA: Diagnosis not present

## 2016-11-13 DIAGNOSIS — E559 Vitamin D deficiency, unspecified: Secondary | ICD-10-CM | POA: Diagnosis present

## 2016-11-13 DIAGNOSIS — Z6836 Body mass index (BMI) 36.0-36.9, adult: Secondary | ICD-10-CM | POA: Diagnosis not present

## 2016-11-13 DIAGNOSIS — K819 Cholecystitis, unspecified: Secondary | ICD-10-CM

## 2016-11-13 DIAGNOSIS — Z8249 Family history of ischemic heart disease and other diseases of the circulatory system: Secondary | ICD-10-CM | POA: Diagnosis not present

## 2016-11-13 DIAGNOSIS — E876 Hypokalemia: Secondary | ICD-10-CM | POA: Diagnosis not present

## 2016-11-13 DIAGNOSIS — R7303 Prediabetes: Secondary | ICD-10-CM | POA: Diagnosis present

## 2016-11-13 DIAGNOSIS — M79671 Pain in right foot: Secondary | ICD-10-CM | POA: Diagnosis present

## 2016-11-13 DIAGNOSIS — R52 Pain, unspecified: Secondary | ICD-10-CM

## 2016-11-13 HISTORY — DX: Cholecystitis, unspecified: K81.9

## 2016-11-13 LAB — APTT: aPTT: 38 seconds — ABNORMAL HIGH (ref 24–36)

## 2016-11-13 LAB — URINALYSIS, ROUTINE W REFLEX MICROSCOPIC
BILIRUBIN URINE: NEGATIVE
Glucose, UA: NEGATIVE mg/dL
Ketones, ur: 80 mg/dL — AB
Leukocytes, UA: NEGATIVE
Nitrite: NEGATIVE
PH: 5 (ref 5.0–8.0)
Protein, ur: NEGATIVE mg/dL
SPECIFIC GRAVITY, URINE: 1.027 (ref 1.005–1.030)

## 2016-11-13 LAB — COMPREHENSIVE METABOLIC PANEL
ALK PHOS: 65 U/L (ref 38–126)
ALT: 251 U/L — ABNORMAL HIGH (ref 14–54)
ANION GAP: 9 (ref 5–15)
AST: 241 U/L — ABNORMAL HIGH (ref 15–41)
Albumin: 3.9 g/dL (ref 3.5–5.0)
BUN: 5 mg/dL — ABNORMAL LOW (ref 6–20)
CALCIUM: 9.3 mg/dL (ref 8.9–10.3)
CHLORIDE: 104 mmol/L (ref 101–111)
CO2: 21 mmol/L — AB (ref 22–32)
Creatinine, Ser: 0.74 mg/dL (ref 0.44–1.00)
GFR calc non Af Amer: >60 mL/min (ref >60)
Glucose, Bld: 152 mg/dL — ABNORMAL HIGH (ref 65–99)
Potassium: 3.6 mmol/L (ref 3.5–5.1)
SODIUM: 134 mmol/L — AB (ref 135–145)
Total Bilirubin: 0.8 mg/dL (ref 0.3–1.2)
Total Protein: 7.6 g/dL (ref 6.5–8.1)

## 2016-11-13 LAB — PROTIME-INR
INR: 1.35
Prothrombin Time: 16.7 seconds — ABNORMAL HIGH (ref 11.4–15.2)

## 2016-11-13 LAB — CBC
HCT: 38.7 % (ref 36.0–46.0)
HEMOGLOBIN: 12.8 g/dL (ref 12.0–15.0)
MCH: 26.1 pg (ref 26.0–34.0)
MCHC: 33.1 g/dL (ref 30.0–36.0)
MCV: 78.8 fL (ref 78.0–100.0)
Platelets: 348 10*3/uL (ref 150–400)
RBC: 4.91 MIL/uL (ref 3.87–5.11)
RDW: 14.1 % (ref 11.5–15.5)
WBC: 10.4 10*3/uL (ref 4.0–10.5)

## 2016-11-13 LAB — SURGICAL PCR SCREEN
MRSA, PCR: NEGATIVE
Staphylococcus aureus: NEGATIVE

## 2016-11-13 LAB — POC URINE PREG, ED: PREG TEST UR: NEGATIVE

## 2016-11-13 LAB — LIPASE, BLOOD: LIPASE: 39 U/L (ref 11–51)

## 2016-11-13 LAB — HEPARIN LEVEL (UNFRACTIONATED): HEPARIN UNFRACTIONATED: 1.24 [IU]/mL — AB (ref 0.30–0.70)

## 2016-11-13 MED ORDER — ONDANSETRON HCL 4 MG/2ML IJ SOLN
4.0000 mg | Freq: Four times a day (QID) | INTRAMUSCULAR | Status: DC | PRN
  Administered 2016-11-14 – 2016-11-15 (×2): 4 mg via INTRAVENOUS
  Filled 2016-11-13: qty 2

## 2016-11-13 MED ORDER — ONDANSETRON 4 MG PO TBDP
ORAL_TABLET | ORAL | Status: AC
  Filled 2016-11-13: qty 1

## 2016-11-13 MED ORDER — ONDANSETRON HCL 4 MG/2ML IJ SOLN
4.0000 mg | Freq: Once | INTRAMUSCULAR | Status: AC
  Administered 2016-11-13: 4 mg via INTRAVENOUS
  Filled 2016-11-13: qty 2

## 2016-11-13 MED ORDER — PIPERACILLIN-TAZOBACTAM 3.375 G IVPB
3.3750 g | Freq: Three times a day (TID) | INTRAVENOUS | Status: DC
  Administered 2016-11-13 – 2016-11-16 (×8): 3.375 g via INTRAVENOUS
  Filled 2016-11-13 (×11): qty 50

## 2016-11-13 MED ORDER — PIPERACILLIN-TAZOBACTAM 3.375 G IVPB 30 MIN: 3.3750 g | Freq: Three times a day (TID) | INTRAVENOUS | Status: DC

## 2016-11-13 MED ORDER — DOCUSATE SODIUM 100 MG PO CAPS
100.0000 mg | ORAL_CAPSULE | Freq: Two times a day (BID) | ORAL | Status: DC
  Administered 2016-11-13 – 2016-11-17 (×7): 100 mg via ORAL
  Filled 2016-11-13 (×6): qty 1

## 2016-11-13 MED ORDER — ONDANSETRON HCL 4 MG PO TABS
4.0000 mg | ORAL_TABLET | Freq: Four times a day (QID) | ORAL | Status: DC | PRN
  Administered 2016-11-16 – 2016-11-17 (×3): 4 mg via ORAL
  Filled 2016-11-13 (×3): qty 1

## 2016-11-13 MED ORDER — SODIUM CHLORIDE 0.9 % IV BOLUS (SEPSIS)
1000.0000 mL | Freq: Once | INTRAVENOUS | Status: AC
  Administered 2016-11-13: 1000 mL via INTRAVENOUS

## 2016-11-13 MED ORDER — DEXTROSE-NACL 5-0.45 % IV SOLN
INTRAVENOUS | Status: DC
  Administered 2016-11-13 – 2016-11-14 (×3): via INTRAVENOUS

## 2016-11-13 MED ORDER — HEPARIN (PORCINE) IN NACL 100-0.45 UNIT/ML-% IJ SOLN
1450.0000 [IU]/h | INTRAMUSCULAR | Status: AC
  Administered 2016-11-13 – 2016-11-14 (×2): 1300 [IU]/h via INTRAVENOUS
  Filled 2016-11-13 (×3): qty 250

## 2016-11-13 MED ORDER — MORPHINE SULFATE (PF) 4 MG/ML IV SOLN
4.0000 mg | Freq: Once | INTRAVENOUS | Status: AC
  Administered 2016-11-13: 4 mg via INTRAVENOUS
  Filled 2016-11-13: qty 1

## 2016-11-13 MED ORDER — MORPHINE SULFATE (PF) 4 MG/ML IV SOLN
2.0000 mg | INTRAVENOUS | Status: DC | PRN
  Administered 2016-11-13 – 2016-11-14 (×5): 2 mg via INTRAVENOUS
  Filled 2016-11-13 (×5): qty 1

## 2016-11-13 MED ORDER — HEPARIN BOLUS VIA INFUSION
4000.0000 [IU] | Freq: Once | INTRAVENOUS | Status: AC
  Administered 2016-11-13: 4000 [IU] via INTRAVENOUS
  Filled 2016-11-13: qty 4000

## 2016-11-13 MED ORDER — GADOBENATE DIMEGLUMINE 529 MG/ML IV SOLN
20.0000 mL | Freq: Once | INTRAVENOUS | Status: AC | PRN
  Administered 2016-11-13: 20 mL via INTRAVENOUS

## 2016-11-13 MED ORDER — ONDANSETRON 4 MG PO TBDP
4.0000 mg | ORAL_TABLET | Freq: Once | ORAL | Status: AC | PRN
Start: 2016-11-13 — End: 2016-11-13
  Administered 2016-11-13: 4 mg via ORAL

## 2016-11-13 NOTE — Consult Note (Signed)
Reason for Consult:cholecystitis Referring Physician: dr Nori Riis  Carla Alexander is an 46 y.o. female.  HPI: 46 year old female presented with right upper quadrant pain that was persistent and was found to have cholecystitis with questionable choledocholithiasis. She states that she developed acute onset of right-sided upper abdominal pain last Friday evening. It was intense for a few hours but then eased up but symptoms lasted most of the night. She had a recurrent episode on Friday and the pain did not get better it was associated with vomiting. It did not radiate. Because the pain was persistent she came to the emergency room. She reports subjective fevers and chills. She denies any hematemesis, melena or hematochezia. She denies any acholic stools. She denies any frequent NSAID use.  She is on an oral anticoagulant because of her remote history of 2 prior blood clots. Please see review of systems were additional information  Past Medical History:  Diagnosis Date  . Anxiety   . DVT (deep venous thrombosis) ([place])   . Fibroid     Past Surgical History:  Procedure Laterality Date  . BREAST BIOPSY Left    benign  . TUBAL LIGATION    . VAGINAL HYSTERECTOMY  2010   [facility]    Family History  Problem Relation Age of Onset  . Heart disease Mother   . Diabetes Mother   . Stroke Father   . Diabetes Sister   . Heart disease Sister   . Heart disease Maternal Grandfather   . Diabetes Maternal Grandfather   . Cancer Maternal Grandfather        Colon  . Diabetes Maternal Grandmother   . Cancer Maternal Grandmother        Unknown type    Social History:  reports that she has never smoked. She has never used smokeless tobacco. She reports that she does not drink alcohol or use drugs.  Allergies: No Known Allergies  Medications: I have reviewed the patient's current medications.  Results for orders placed or performed during the hospital encounter of 11/13/16 (from the past 48 hour(s))    Lipase, blood     Status: None   Collection Time: 11/13/16  2:41 AM  Result Value Ref Range   Lipase 39 11 - 51 U/L  Comprehensive metabolic panel     Status: Abnormal   Collection Time: 11/13/16  2:41 AM  Result Value Ref Range   Sodium 134 (L) 135 - 145 mmol/L   Potassium 3.6 3.5 - 5.1 mmol/L   Chloride 104 101 - 111 mmol/L   CO2 21 (L) 22 - 32 mmol/L   Glucose, Bld 152 (H) 65 - 99 mg/dL   BUN 5 (L) 6 - 20 mg/dL   Creatinine, Ser 0.74 0.44 - 1.00 mg/dL   Calcium 9.3 8.9 - 10.3 mg/dL   Total Protein 7.6 6.5 - 8.1 g/dL   Albumin 3.9 3.5 - 5.0 g/dL   AST 241 (H) 15 - 41 U/L   ALT 251 (H) 14 - 54 U/L   Alkaline Phosphatase 65 38 - 126 U/L   Total Bilirubin 0.8 0.3 - 1.2 mg/dL   GFR calc non Af Amer >60 >60 mL/min   GFR calc Af Amer >60 >60 mL/min    Comment: (NOTE) The eGFR has been calculated using the CKD EPI equation. This calculation has not been validated in all clinical situations. eGFR's persistently <60 mL/min signify possible Chronic Kidney Disease.    Anion gap 9 5 - 15  CBC  Status: None   Collection Time: 11/13/16  2:41 AM  Result Value Ref Range   WBC 10.4 4.0 - 10.5 K/uL   RBC 4.91 3.87 - 5.11 MIL/uL   Hemoglobin 12.8 12.0 - 15.0 g/dL   HCT 38.7 36.0 - 46.0 %   MCV 78.8 78.0 - 100.0 fL   MCH 26.1 26.0 - 34.0 pg   MCHC 33.1 30.0 - 36.0 g/dL   RDW 14.1 11.5 - 15.5 %   Platelets 348 150 - 400 K/uL  Urinalysis, Routine w reflex microscopic     Status: Abnormal   Collection Time: 11/13/16  2:51 AM  Result Value Ref Range   Color, Urine YELLOW YELLOW   APPearance HAZY (A) CLEAR   Specific Gravity, Urine 1.027 1.005 - 1.030   pH 5.0 5.0 - 8.0   Glucose, UA NEGATIVE NEGATIVE mg/dL   Hgb urine dipstick MODERATE (A) NEGATIVE   Bilirubin Urine NEGATIVE NEGATIVE   Ketones, ur 80 (A) NEGATIVE mg/dL   Protein, ur NEGATIVE NEGATIVE mg/dL   Nitrite NEGATIVE NEGATIVE   Leukocytes, UA NEGATIVE NEGATIVE   RBC / HPF 0-5 0 - 5 RBC/hpf   WBC, UA 0-5 0 - 5 WBC/hpf    Bacteria, UA RARE (A) NONE SEEN   Squamous Epithelial / LPF 0-5 (A) NONE SEEN   Mucous PRESENT    Hyaline Casts, UA PRESENT   POC Urine Pregnancy, ED (do NOT order at [facility])     Status: None   Collection Time: 11/13/16  2:59 AM  Result Value Ref Range   Preg Test, Ur NEGATIVE NEGATIVE    Comment:        THE SENSITIVITY OF THIS METHODOLOGY IS >24 mIU/mL   APTT     Status: Abnormal   Collection Time: 11/13/16  1:25 PM  Result Value Ref Range   aPTT 38 (H) 24 - 36 seconds    Comment:        IF BASELINE aPTT IS ELEVATED, SUGGEST PATIENT RISK ASSESSMENT BE USED TO DETERMINE APPROPRIATE ANTICOAGULANT THERAPY.   Protime-INR     Status: Abnormal   Collection Time: 11/13/16  1:25 PM  Result Value Ref Range   Prothrombin Time 16.7 (H) 11.4 - 15.2 seconds   INR 1.35   Heparin level (unfractionated)     Status: Abnormal   Collection Time: 11/13/16  1:25 PM  Result Value Ref Range   Heparin Unfractionated 1.24 (H) 0.30 - 0.70 IU/mL    Comment: RESULTS CONFIRMED BY MANUAL DILUTION        IF HEPARIN RESULTS ARE BELOW EXPECTED VALUES, AND PATIENT DOSAGE HAS BEEN CONFIRMED, SUGGEST FOLLOW UP TESTING OF ANTITHROMBIN III LEVELS.     Mr Abdomen Mrcp W Wo Contast  Result Date: 11/13/2016 CLINICAL DATA:  Abdominal pain x2 days radiating to back, nausea/vomiting. Abnormal gallbladder with possible CBD stone on ultrasound. EXAM: MRI ABDOMEN WITHOUT AND WITH CONTRAST (INCLUDING MRCP) TECHNIQUE: Multiplanar multisequence MR imaging of the abdomen was performed both before and after the administration of intravenous contrast. Heavily T2-weighted images of the biliary and pancreatic ducts were obtained, and three-dimensional MRCP images were rendered by post processing. CONTRAST:  84m MULTIHANCE GADOBENATE DIMEGLUMINE 529 MG/ML IV SOLN COMPARISON:  Right upper quadrant ultrasound dated 11/13/2016. CT abdomen/pelvis dated 10/26/2008. FINDINGS: Lower chest: Lung bases are clear. Hepatobiliary: Liver  is within normal limits. No suspicious/enhancing hepatic lesions. Cholelithiasis, including a 1.6 cm stone in the gallbladder neck (series 10/ image 16). Associated gallbladder wall thickening/ pericholecystic fluid, suggesting acute  cholecystitis. No intrahepatic or extrahepatic ductal dilatation. Common duct measures 4 mm (series 8/image 21). No choledocholithiasis is seen. Pancreas:  Within normal limits. Spleen:  Within normal limits. Adrenals/Urinary Tract:  Adrenal glands are within normal limits. Kidneys are within normal limits.  No hydronephrosis. Stomach/Bowel: Stomach is within normal limits. Visualized bowel is unremarkable. Vascular/Lymphatic:  No evidence of abdominal aortic aneurysm. No suspicious abdominal lymphadenopathy. Other: Mild perihepatic and perisplenic ascites. Additional fluid along the hepatic flexure of the colon. Musculoskeletal: No focal osseous lesions. IMPRESSION: Cholelithiasis with acute cholecystitis. No intrahepatic or extrahepatic ductal dilatation. Common duct measures 4 mm. No choledocholithiasis is seen. Reactive upper abdominal ascites. Electronically Signed   By: Julian Hy M.D.   On: 11/13/2016 16:39   US Abdomen Limited Ruq  Result Date: 11/13/2016 CLINICAL DATA:  Elevated liver enzymes. Right upper quadrant pain and vomiting since Friday. EXAM: US ABDOMEN LIMITED - RIGHT UPPER QUADRANT COMPARISON:  None. FINDINGS: Gallbladder: Multiple gallstones, largest measuring 2.2 cm. Gallbladder walls are thickened circumferentially, with associated pericholecystic fluid/edema. Common bile duct: Diameter: Presumed common bile duct stone measuring 1.6 cm greatest dimension. Surrounding common bile duct not well seen, presumably obscured by overlying bowel. Liver: Small echogenic focus within the right liver lobe measuring 11 mm, too small to definitively characterize, most suggestive of benign hemangioma. Liver otherwise unremarkable. Mild intrahepatic bile duct  dilatation within the left liver lobe. IMPRESSION: 1. Gallbladder is distended and gallbladder walls are thickened circumferentially, with surrounding pericholecystic fluid/edema, consistent with acute cholecystitis. Also multiple gallstones, largest measuring 2.2 cm. 2. Probable stone within the CBD measuring 1.6 cm. Surrounding CBD not well seen, presumably obscured by overlying bowel gas. Consider MRCP or ERCP for confirmation. 3. Associated mild intrahepatic bile duct dilatation within the left liver lobe. Electronically Signed   By: Franki Cabot M.D.   On: 11/13/2016 08:38    Review of Systems  Constitutional: Negative for weight loss.  HENT: Negative for nosebleeds.   Eyes: Negative for blurred vision.  Respiratory: Negative for shortness of breath.   Cardiovascular: Negative for chest pain, palpitations, orthopnea and PND.       Denies DOE  Gastrointestinal: Positive for abdominal pain, nausea and vomiting.  Genitourinary: Negative for dysuria and hematuria.  Musculoskeletal: Negative.   Skin: Negative for itching and rash.  Neurological: Negative for dizziness, focal weakness, seizures, loss of consciousness and headaches.       Denies TIAs, amaurosis fugax  Endo/Heme/Allergies: Does not bruise/bleed easily.       Remote h/o of 2 blood clots; states clots were spontaneous - no preceding trauma, sx. States didn't have blood work up  Psychiatric/Behavioral: The patient is not nervous/anxious.    Blood pressure 140/80, pulse 86, temperature 99 F (37.2 C), temperature source Oral, resp. rate 20, height _0  (1.727 m), weight 110 kg (242 lb 9.6 oz), last menstrual period 11/01/2008, SpO2 100 %. Physical Exam  Vitals reviewed. Constitutional: She is oriented to person, place, and time. She appears well-developed and well-nourished. No distress.  HENT:  Head: Normocephalic and atraumatic.  Right Ear: External ear normal.  Left Ear: External ear normal.  Eyes: Conjunctivae are normal.  No scleral icterus.  Neck: Normal range of motion. Neck supple. No tracheal deviation present. No thyromegaly present.  Cardiovascular: Normal rate and normal heart sounds.   Respiratory: Effort normal and breath sounds normal. No stridor. No respiratory distress. She has no wheezes.  GI: Soft. Normal appearance. She exhibits no distension. There is tenderness in the  right upper quadrant. There is no rigidity, no rebound and no guarding.    Musculoskeletal: She exhibits no edema or tenderness.  Lymphadenopathy:    She has no cervical adenopathy.  Neurological: She is alert and oriented to person, place, and time. She exhibits normal muscle tone.  Skin: Skin is warm and dry. No rash noted. She is not diaphoretic. No erythema. No pallor.  Psychiatric: She has a normal mood and affect. Her behavior is normal. Judgment and thought content normal.    Assessment/Plan: Acute calculous cholecystitis h/o DVT, on oral anticoagulant-last dose Saturday night obesity  Her symptoms are consistent with acute cholecystitis. Initial ultrasound imaging was concerning for possible choledocholithiasis. MRI revealed no evidence of choledocholithiasis. Therefore this point we will plan cholecystectomy at the discretion of Dr. Ninfa Linden. She will need to be off oral anticoagulant for at least 48 hours and then timing will be up to Dr. Ninfa Linden. In the interim she may have a diet. I discussed cholecystitis and choledocholithiasis extensively with the patient and her extended family. A diagram was drawn. I discussed the typical hospitalization as well as the typical recovery. We discussed the possibility of a common bile duct stones still.  IV antibiotics Daily labs Agree with heparin given history of DVTs. It is unclear whether or not she has a hypercoagulable disorder. I recommended that she discuss this with her outpatient primary team mainly with respect to familial implications.  Leighton Ruff. Redmond Pulling, MD,  FACS General, Bariatric, & Minimally Invasive Surgery [facility], [place]  [facility] M 11/13/2016, 8:40 PM

## 2016-11-13 NOTE — Progress Notes (Signed)
Report received from [place], [place] in the ED.

## 2016-11-13 NOTE — Progress Notes (Signed)
ANTICOAGULATION CONSULT NOTE - Initial Consult  Pharmacy Consult for Heparin (Xarelto pta) Indication: DVT    No Known Allergies  Patient Measurements: Height: 5\' 8"  (172.7 cm) Weight: 242 lb 9.6 oz (110 kg) IBW/kg (Calculated) : 63.9 Heparin Dosing Weight: 89 kg  Vital Signs: Temp: 99 F (37.2 C) (05/13 1226) Temp Source: Oral (05/13 1226) BP: 140/80 (05/13 1226) Pulse Rate: 86 (05/13 1226)  Labs:  Recent Labs  11/13/16 0241 11/13/16 1325  HGB 12.8  --   HCT 38.7  --   PLT 348  --   APTT  --  38*  LABPROT  --  16.7*  INR  --  1.35  HEPARINUNFRC  --  1.24*  CREATININE 0.74  --     Estimated Creatinine Clearance: 114.2 mL/min (by C-G formula based on SCr of 0.74 mg/dL).   Medical History: Past Medical History:  Diagnosis Date  . Anxiety   . DVT (deep venous thrombosis) ([place])   . 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   Last dose of Xarelto was at [facility] on 11/12/16.  CBC is within normal limits. Baseline INR 1.35, APTT 38/PT 16.7, and HL 1.24. Baseline heparin level elevated in setting of recent Xarelto- will use APTT for dosing.  SCr 0.74.    Goal of Therapy:  Heparin level 0.3-0.7 units/ml aPTT 66-102 seconds Monitor platelets by anticoagulation protocol: Yes   Plan:  Delay heparin start until 1900 PM (24 hours since last dose of Xarelto). At 1900 PM- Begin Heparin at 1300 units/hr with 4000 unit bolus Daily aPTT, HL (until correlating) and CBC while on therapy. Monitor for signs and symptoms of bleeding. Follow-up surgery plans and need to hold IV heparin.  Sloan Leiter, PharmD, BCPS Clinical Pharmacist Clinical phone 11/13/2016 until 3:30 PM- [phone] After hours, please call [phone] 11/13/2016,4:15 PM   Molleigh Huot, Jake Church  11/13/2016 4:15 PM

## 2016-11-13 NOTE — ED Notes (Signed)
Attempted to call report. Raquita [phone] requests to be called back in 10-15 minutes.

## 2016-11-13 NOTE — ED Triage Notes (Signed)
Pt presents to ED for assessment of abdominal pain which started after patient had n/v/d x 2 days.  Patient states diarrhea has subsided, but continues to have vomiting.  Pt c/o back pain associated as well.

## 2016-11-13 NOTE — ED Notes (Signed)
Patient transported to Ultrasound 

## 2016-11-13 NOTE — Progress Notes (Signed)
Patient arrived to the floor via stretcher. Patient is alert and oriented X4. VSS. Will continue to monitor.

## 2016-11-13 NOTE — Consult Note (Addendum)
Referring Provider:  ER provider Primary Care Physician:  Steve Rattler, DO Primary Gastroenterologist:  Althia Forts  Reason for Consultation:  CBD stone  HPI: Carla Alexander is a 46 y.o. female with past medical history significant for DVT currently on Xarelto came into the hospital with abdominal pain nausea and vomiting. Was found to have abnormal LFTs upon initial evaluation. Subsequent ultrasound showed possible acute cholecystitis and around 1.6 cm stone in the CBD. GI is consulted for further evaluation.  Patient seen and examined. According to patient she initially noted generalized abdominal discomfort along with nausea and vomiting around a week ago which resolved within 24 hours. Patient again started having similar symptoms 2 days ago but there were more severe this time. Patient described her pain as a stabbing epigastric as well as generalized with some radiation to substernal area as well as on the back. Associated with nausea and nonbloody vomiting. Patient initially had constipation which is resolved now. Denied any blood in the stool or black stool. Subjective fever. Denied any weight loss. Because of ongoing pain she came into the ER for further evaluation.   No family history of colon cancer. No previous endoscopic workup.    Past Medical History:  Diagnosis Date  . Anxiety   . DVT (deep venous thrombosis) ([place])   . Fibroid     Past Surgical History:  Procedure Laterality Date  . BREAST BIOPSY Left    benign  . TUBAL LIGATION    . VAGINAL HYSTERECTOMY  2010   [facility]    Prior to Admission medications   Medication Sig Start Date End Date Taking? Authorizing Provider  acetaminophen (TYLENOL) 500 MG tablet Take 1,000 mg by mouth every 6 (six) hours as needed for fever (pain).   Yes [provider]  Cholecalciferol ([employer] VITAMIN D3) 1000 units capsule Take 1 capsule (1,000 Units total) by mouth daily. 10/24/16  Yes Riccio, Gardiner Rhyme, DO  oxymetazoline (AFRIN  NASAL SPRAY) 0.05 % nasal spray Place 1 spray into both nostrils 2 (two) times daily. Patient taking differently: Place 1 spray into both nostrils 2 (two) times daily as needed for congestion.  09/06/15  Yes Carlota Raspberry, Tiffany, PA-C  phentermine (ADIPEX-P) 37.5 MG tablet Take 37.5 mg by mouth daily before breakfast.   Yes [provider]  traMADol (ULTRAM) 50 MG tablet Take 1 tablet (50 mg total) by mouth every 6 (six) hours as needed. Patient taking differently: Take 50 mg by mouth every 6 (six) hours as needed for moderate pain.  09/06/15  Yes Carlota Raspberry, Tiffany, PA-C  XARELTO 20 MG TABS tablet TAKE 1 TABLET (20 MG TOTAL) BY MOUTH DAILY WITH SUPPER. 06/29/16  Yes Riccio, Angela C, DO    Scheduled Meds: Continuous Infusions: PRN Meds:.morphine injection  Allergies as of 11/13/2016  . (No Known Allergies)    Family History  Problem Relation Age of Onset  . Heart disease Mother   . Diabetes Mother   . Stroke Father   . Diabetes Sister   . Heart disease Sister   . Heart disease Maternal Grandfather   . Diabetes Maternal Grandfather   . Cancer Maternal Grandfather        Colon  . Diabetes Maternal Grandmother   . Cancer Maternal Grandmother        Unknown type    Social History   Social History  . Marital status: Married    Spouse name: N/A  . Number of children: 2  . Years of education: N/A  Occupational History  . Teacher [employer]   Social History Main Topics  . Smoking status: Never Smoker  . Smokeless tobacco: Never Used  . Alcohol use No  . Drug use: No  . Sexual activity: Yes    Birth control/ protection: Surgical   Other Topics Concern  . Not on file   Social History Narrative  . No narrative on file    Review of Systems: Review of Systems  Constitutional: Positive for malaise/fatigue. Negative for chills, fever and weight loss.  HENT: Negative for ear discharge, ear pain, hearing loss and tinnitus.   Eyes: Negative for blurred vision, double  vision, photophobia and pain.  Respiratory: Negative for cough and hemoptysis.   Cardiovascular: Negative for chest pain and palpitations.  Gastrointestinal: Positive for abdominal pain, constipation, heartburn, nausea and vomiting. Negative for blood in stool and melena.  Genitourinary: Negative for dysuria and urgency.  Musculoskeletal: Positive for back pain. Negative for myalgias.  Skin: Negative for rash.  Neurological: Negative for dizziness, focal weakness, seizures and loss of consciousness.  Endo/Heme/Allergies: Does not bruise/bleed easily.  Psychiatric/Behavioral: Negative for depression, memory loss and suicidal ideas.    Physical Exam: Vital signs: Vitals:   11/13/16 1100 11/13/16 1130  BP: (!) 142/83 139/83  Pulse: 82 76  Resp:    Temp:       Physical Exam  Constitutional: She is oriented to person, place, and time. She appears well-developed and well-nourished. No distress.  HENT:  Head: Normocephalic and atraumatic.  Nose: Nose normal.  Mouth/Throat: No oropharyngeal exudate.  Eyes: EOM are normal. Left eye exhibits no discharge. No scleral icterus.  Neck: Normal range of motion. Neck supple. No thyromegaly present.  Cardiovascular: Normal rate, regular rhythm and normal heart sounds.   Respiratory: Effort normal and breath sounds normal. No respiratory distress. She has no wheezes.  GI: Soft. Bowel sounds are normal. She exhibits no distension. There is tenderness. There is no rebound and no guarding.  Patient with significant right upper quadrant tenderness to palpation  Musculoskeletal: Normal range of motion. She exhibits no edema or tenderness.  Neurological: She is alert and oriented to person, place, and time.  Skin: Skin is dry. No erythema.  Psychiatric: She has a normal mood and affect. Judgment normal.    GI:  Lab Results:  Recent Labs  11/13/16 0241  WBC 10.4  HGB 12.8  HCT 38.7  PLT 348   BMET  Recent Labs  11/13/16 0241  NA 134*   K 3.6  CL 104  CO2 21*  GLUCOSE 152*  BUN 5*  CREATININE 0.74  CALCIUM 9.3   LFT  Recent Labs  11/13/16 0241  PROT 7.6  ALBUMIN 3.9  AST 241*  ALT 251*  ALKPHOS 65  BILITOT 0.8   PT/INR No results for input(s): LABPROT, INR in the last 72 hours.   Studies/Results: US Abdomen Limited Ruq  Result Date: 11/13/2016 CLINICAL DATA:  Elevated liver enzymes. Right upper quadrant pain and vomiting since Friday. EXAM: US ABDOMEN LIMITED - RIGHT UPPER QUADRANT COMPARISON:  None. FINDINGS: Gallbladder: Multiple gallstones, largest measuring 2.2 cm. Gallbladder walls are thickened circumferentially, with associated pericholecystic fluid/edema. Common bile duct: Diameter: Presumed common bile duct stone measuring 1.6 cm greatest dimension. Surrounding common bile duct not well seen, presumably obscured by overlying bowel. Liver: Small echogenic focus within the right liver lobe measuring 11 mm, too small to definitively characterize, most suggestive of benign hemangioma. Liver otherwise unremarkable. Mild intrahepatic bile duct dilatation  within the left liver lobe. IMPRESSION: 1. Gallbladder is distended and gallbladder walls are thickened circumferentially, with surrounding pericholecystic fluid/edema, consistent with acute cholecystitis. Also multiple gallstones, largest measuring 2.2 cm. 2. Probable stone within the CBD measuring 1.6 cm. Surrounding CBD not well seen, presumably obscured by overlying bowel gas. Consider MRCP or ERCP for confirmation. 3. Associated mild intrahepatic bile duct dilatation within the left liver lobe. Electronically Signed   By: Franki Cabot M.D.   On: 11/13/2016 08:38    Impression/Plan: - Abnormal ultrasound showing probable stone within CBD measuring 1.6 cm. - Possible acute cholecystitis - Abnormal LFTs. Secondary to above  - History of recurrent DVT. On Xarelto  Last dose yesterday 7 PM.  Recommendations ------------------------- - Patient currently  has no signs of ascending cholangitis. Afebrile. Normal white count. Last dose of Xarelto was yesterday, so we will not be able to perform any endoscopic intervention/ ERCP probably until Tuesday. MRI MRCP for further evaluation. Start Zosyn given significant right upper quadrant pain and ultrasound concerning for acute cholecystitis/ CBD stone. Hold Xarelto. Possible ERCP on Tuesday  -Ok  to have full liquid diet after MRI. - GI will follow   LOS: 0 days   Otis Brace  MD, FACP 11/13/2016, 12:09 PM  Pager [phone] If no answer or after 5 PM call [phone]

## 2016-11-13 NOTE — H&P (Signed)
[facility] Admission History and Physical Service Pager: [phone]  Patient name: Carla Alexander Medical record number: [id number] Date of birth: February 24, 1971 Age: 46 y.o. Gender: female  Primary Care Provider: Steve Rattler, DO Consultants: General surgery, GI Code Status: FULL    Chief Complaint: abdominal pain   Assessment and Plan: Carla Alexander is a 46 y.o. female presenting with RUQ tenderness, nausea and vomiting x 2days. PMH is significant for history of DVT (on Xarelto), anemia, panic attacks, and history of L breast mass.    Abdominal pain, nausea and vomiting likely 2/2 acute cholecystitis.   On arrival to ED, patient with continued emesis.  Afebrile with normal white count.  With subjective fevers at home.  On exam, nontoxic appearing with VSS.  Exam with TTP over RUQ/epigastric region.  CMET notable for elevated LFTs: AST 241 and ALT 251.  UA hazy with rare bacteria, mod hgb, negative leuks, moderate mucus, negative nitrates.  Hepatitis panel ordered.  ED bedside Ultrasound RUQ performed which revealed acute cholecystitis with multiple gallstones and probable 1.6 cm stone in CBD.  In ED she was given morphine for pain control and Zofran for nausea.  Surgery (Dr. Redmond Pulling) consulted in ED and will admit to Medicine.  GI also consulted due to stone in CBD.   - Admit to [facility], attending Dr. Nori Riis   - Per GI, no current signs of ascending cholangitis.  Due to last dose Xarelto yesterday, no endoscopic intervention/ERCP until probably Tuesday.  MRI/MRCP today for further eval and possible ERCP on Tuesday.   - Zosyn started in setting of significant RUQ pain and [place] concerning for acute chole/CBD stone.   - Diet NPO, can start full liquid diet after MRI   - Hold Xarelto.  Will start heparin drip for continued DVT anticoagulation for now.  This will be more easily discontinued for procedures. - s/p 1L NS bolus in ED.  IV D5 1/2 NS at 125 cc/hour while NPO -  AM PT/INR   - IV morphine 2 mg Q2 PRN for pain control   - Zofran PRN for nausea  - Hepatitis panel pending  - vitals per unit routine  - repeat CMP in am. Follow LFTs  History of DVT.  On Xarelto for the last 4 years.  Last took Xarelto yesterday at [facility].  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   - Hold home Xarelto.   - Heparin gtt  Vit D deficiency.  At home on daily cholecalciferol.  -Held on admission   Weight loss.  Patient at home on phentermine 37.5 mg daily.  -Hold phentermine    FEN/GI: NPO, IV D5 1/2 NS @ 125 cc/hour  Prophylaxis: Heparin  Disposition: Admit to [facility], attending Dr. Nori Riis   History of Present Illness:  Carla Alexander is a 46 y.o. female presenting with abdominal pain.    Patient reports onset of generalized abdominal pain last Saturday, worse over the last 2 days.  Pain radiates to her back. She reports nausea and vomiting over the last fews days.  She has been taking gas-x, tylenol, ex-lax with little improvement.  Reports decreased PO intake due to nausea.  She denies diarrhea.  She denies jaundice, icterus, fevers, chills.  She reports h/o DVT in 2011 and 2012 in her LLE.  She is anticoagulated on Xarelto.  Last dose 11/12/16 around 7 pm.  Denies ETOH, tobacco use, drugs.  She resides with her husband and her  children.  She reports independence in her ADLs.   Review Of Systems: Per HPI with the following additions:   Review of Systems  Constitutional: Positive for fever. Negative for chills, diaphoresis and weight loss.  HENT: Negative for congestion and sore throat.   Respiratory: Negative for cough and shortness of breath.   Cardiovascular: Positive for leg swelling. Negative for chest pain, palpitations and orthopnea.  Gastrointestinal: Positive for abdominal pain, nausea and vomiting. Negative for diarrhea and heartburn.  Genitourinary: Negative for dysuria, frequency and urgency.  Skin: Negative for itching and rash.  Neurological:  Negative for weakness.   Patient Active Problem List   Diagnosis Date Noted  . Cholecystitis 11/13/2016  . History of DVT (deep vein thrombosis) 11/13/2016  . Acute cholecystitis 11/13/2016  . Choledocholithiasis   . Arm pain, musculoskeletal, left 07/11/2016  . Breast mass, left 08/07/2014  . Abnormal mammogram 07/28/2014  . Unspecified vitamin D deficiency 12/11/2013  . Abnormal TSH 12/11/2013  . Breast pain in female 07/26/2013  . Health care maintenance 07/03/2013  . Panic attacks 09/13/2011  . Anemia 10/01/2010  . Headache(784.0) 08/13/2010  . Chronic anticoagulation 08/13/2010  . DVT (deep venous thrombosis) ([place]) 07/09/2010  . SCIATICA, LEFT 07/09/2010   Past Medical History: Past Medical History:  Diagnosis Date  . Anxiety   . DVT (deep venous thrombosis) ([place])   . Fibroid    Past Surgical History: Past Surgical History:  Procedure Laterality Date  . BREAST BIOPSY Left    benign  . TUBAL LIGATION    . VAGINAL HYSTERECTOMY  2010   [facility]   Social History: Social History  Substance Use Topics  . Smoking status: Never Smoker  . Smokeless tobacco: Never Used  . Alcohol use No   Social: lives with 2 sons and husband.  Nonsmoker, no etoh or drug use.  Please also refer to relevant sections of EMR.  Family History: Family History  Problem Relation Age of Onset  . Heart disease Mother   . Diabetes Mother   . Stroke Father   . Diabetes Sister   . Heart disease Sister   . Heart disease Maternal Grandfather   . Diabetes Maternal Grandfather   . Cancer Maternal Grandfather        Colon  . Diabetes Maternal Grandmother   . Cancer Maternal Grandmother        Unknown type    Allergies and Medications: No Known Allergies No current facility-administered medications on file prior to encounter.    Current Outpatient Prescriptions on File Prior to Encounter  Medication Sig Dispense Refill  . acetaminophen (TYLENOL) 500 MG tablet Take 1,000 mg by mouth every 6  (six) hours as needed for fever (pain).    . Cholecalciferol ([employer]S VITAMIN D3) 1000 units capsule Take 1 capsule (1,000 Units total) by mouth daily. 30 capsule 3  . oxymetazoline (AFRIN NASAL SPRAY) 0.05 % nasal spray Place 1 spray into both nostrils 2 (two) times daily. (Patient taking differently: Place 1 spray into both nostrils 2 (two) times daily as needed for congestion. ) 30 mL 0  . traMADol (ULTRAM) 50 MG tablet Take 1 tablet (50 mg total) by mouth every 6 (six) hours as needed. (Patient taking differently: Take 50 mg by mouth every 6 (six) hours as needed for moderate pain. ) 15 tablet 0  . XARELTO 20 MG TABS tablet TAKE 1 TABLET (20 MG TOTAL) BY MOUTH DAILY WITH SUPPER. 30 tablet 11  . [DISCONTINUED] warfarin (COUMADIN) 10 MG tablet  Take 1 to 1.5 tabs po daily as prescribed in clinic 60 tablet 3   Objective: BP 140/80 (BP Location: Left Arm)   Pulse 86   Temp 99 F (37.2 C) (Oral)   Resp 20   LMP 11/01/2008   SpO2 100%  Exam: General: 46 yo F, laying in hospital bed, NAD, family at bedside  Eyes: PERRL, EOMI, no scleral icterus ENTM: MMM, o/p clear  Neck: supple, no JVD  Cardiovascular: RRR no MRG, palpable pulses  Respiratory: CTAB, normal WOB  Gastrointestinal: soft, ND, +TTP to RUQ, +bs MSK: slight swelling of right foot compared to left, no tenderness noted, no associated erythema Derm: warm, dry, no jaundice Neuro: AOx3, no focal deficits  Psych: normal mood and affect   Labs and Imaging: CBC BMET   Recent Labs Lab 11/13/16 0241  WBC 10.4  HGB 12.8  HCT 38.7  PLT 348    Recent Labs Lab 11/13/16 0241  NA 134*  K 3.6  CL 104  CO2 21*  BUN 5*  CREATININE 0.74  GLUCOSE 152*  CALCIUM 9.3     US Abdomen Limited Ruq  Result Date: 11/13/2016 CLINICAL DATA:  Elevated liver enzymes. Right upper quadrant pain and vomiting since Friday. EXAM: US ABDOMEN LIMITED - RIGHT UPPER QUADRANT COMPARISON:  None. FINDINGS: Gallbladder: Multiple gallstones, largest  measuring 2.2 cm. Gallbladder walls are thickened circumferentially, with associated pericholecystic fluid/edema. Common bile duct: Diameter: Presumed common bile duct stone measuring 1.6 cm greatest dimension. Surrounding common bile duct not well seen, presumably obscured by overlying bowel. Liver: Small echogenic focus within the right liver lobe measuring 11 mm, too small to definitively characterize, most suggestive of benign hemangioma. Liver otherwise unremarkable. Mild intrahepatic bile duct dilatation within the left liver lobe. IMPRESSION: 1. Gallbladder is distended and gallbladder walls are thickened circumferentially, with surrounding pericholecystic fluid/edema, consistent with acute cholecystitis. Also multiple gallstones, largest measuring 2.2 cm. 2. Probable stone within the CBD measuring 1.6 cm. Surrounding CBD not well seen, presumably obscured by overlying bowel gas. Consider MRCP or ERCP for confirmation. 3. Associated mild intrahepatic bile duct dilatation within the left liver lobe. Electronically Signed   By: Franki Cabot M.D.   On: 11/13/2016 08:38     Lovenia Kim, MD 11/13/2016, 3:58 PM PGY-1, [place] [facility] pager: [phone], text pages welcome  I have separately seen and examined the patient. I have discussed the findings and exam with Dr Reesa Chew and agree with the above note.  My changes/additions are outlined in BLUE.   Dre Gamino M. Lajuana Ripple, DO PGY-3, [facility]

## 2016-11-13 NOTE — ED Provider Notes (Signed)
[facility] DEPT Provider Note   CSN: [id number] Arrival date & time: 11/13/16  0225     History   Chief Complaint Chief Complaint  Patient presents with  . Abdominal Pain    HPI Carla Alexander is a 46 y.o. female.  HPI  46 y.o. female, presents to the Emergency Department today complaining of abdominal pain x 2 days with associated N/V/D. Notes that the diarrhea has subsided, but continues to have emesis. States pain is constant and dull ache in RUQ as well as epigastric region. Rates pain 9/10. Notes decrease in BMs and took a laxative with ability to produce movement. Last BM this AM. No fevers. No cough/congestion. No CP/SOB. Does not endorse worsening with eating, but also has not eaten due to nausea. 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 No other symptoms noted.   Past Medical History:  Diagnosis Date  . Anxiety   . DVT (deep venous thrombosis) ([place])   . Fibroid     Patient Active Problem List   Diagnosis Date Noted  . Arm pain, musculoskeletal, left 07/11/2016  . Breast mass, left 08/07/2014  . Abnormal mammogram 07/28/2014  . Unspecified vitamin D deficiency 12/11/2013  . Abnormal TSH 12/11/2013  . Breast pain in female 07/26/2013  . Health care maintenance 07/03/2013  . Panic attacks 09/13/2011  . Anemia 10/01/2010  . Headache(784.0) 08/13/2010  . DVT (deep venous thrombosis) ([place]) 07/09/2010  . 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 BREAST BIOPSY Left    benign  . TUBAL LIGATION    . VAGINAL HYSTERECTOMY  2010   [facility]    OB History    Gravida Para Term Preterm AB Living   2 2 2     2    SAB TAB Ectopic Multiple Live Births                   Home Medications    Prior to Admission medications   Medication Sig Start Date End Date Taking? Authorizing Provider  acetaminophen (TYLENOL) 500 MG tablet Take 1,000 mg by mouth every 6 (six) hours as needed for fever (pain).    Yes [provider]  Cholecalciferol ([employer] VITAMIN D3) 1000 units capsule Take 1 capsule (1,000 Units total) by mouth daily. 10/24/16  Yes Riccio, Gardiner Rhyme, DO  oxymetazoline (AFRIN NASAL SPRAY) 0.05 % nasal spray Place 1 spray into both nostrils 2 (two) times daily. Patient taking differently: Place 1 spray into both nostrils 2 (two) times daily as needed for congestion.  09/06/15  Yes Carlota Raspberry, Tiffany, PA-C  phentermine (ADIPEX-P) 37.5 MG tablet Take 37.5 mg by mouth daily before breakfast.   Yes [provider]  traMADol (ULTRAM) 50 MG tablet Take 1 tablet (50 mg total) by mouth every 6 (six) hours as needed. Patient taking differently: Take 50 mg by mouth every 6 (six) hours as needed for moderate pain.  09/06/15  Yes Carlota Raspberry, Tiffany, PA-C  XARELTO 20 MG TABS tablet TAKE 1 TABLET (20 MG TOTAL) BY MOUTH DAILY WITH SUPPER. 06/29/16  Yes Steve Rattler, DO    Family History Family History  Problem Relation Age of Onset  . Heart disease Mother   . Diabetes Mother   . Stroke Father   . Diabetes Sister   . Heart disease Sister   . Heart disease Maternal Grandfather   . Diabetes Maternal Grandfather   .  Cancer Maternal Grandfather        Colon  . Diabetes Maternal Grandmother   . Cancer Maternal Grandmother        Unknown type    Social History Social History  Substance Use Topics  . Smoking status: Never Smoker  . Smokeless tobacco: Never Used  . Alcohol use No     Allergies   Patient has no known allergies.   Review of Systems Review of Systems ROS reviewed and all are negative for acute change except as noted in the HPI.  Physical Exam Updated Vital Signs BP (!) 157/84   Pulse 66   Temp 98.2 F (36.8 C) (Oral)   Resp 20   LMP 11/01/2008   SpO2 99%   Physical Exam  Constitutional: She is oriented to person, place, and time. Vital signs are normal. She appears well-developed and well-nourished.  NAD. No Active Emesis  HENT:  Head: Normocephalic  and atraumatic.  Right Ear: Hearing normal.  Left Ear: Hearing normal.  Eyes: Conjunctivae and EOM are normal. Pupils are equal, round, and reactive to light.  Neck: Normal range of motion. Neck supple.  Cardiovascular: Normal rate, regular rhythm, normal heart sounds and intact distal pulses.   Pulmonary/Chest: Effort normal and breath sounds normal.  Abdominal: Soft. Normal appearance and bowel sounds are normal. There is tenderness in the right upper quadrant and epigastric area. There is positive Murphy's sign.  Abdomen soft  Musculoskeletal: Normal range of motion.  Neurological: She is alert and oriented to person, place, and time.  Skin: Skin is warm and dry.  Psychiatric: She has a normal mood and affect. Her speech is normal and behavior is normal. Thought content normal.  Nursing note and vitals reviewed.  ED Treatments / Results  Labs (all labs ordered are listed, but only abnormal results are displayed) Labs Reviewed  COMPREHENSIVE METABOLIC PANEL - Abnormal; Notable for the following:       Result Value   Sodium 134 (*)    CO2 21 (*)    Glucose, Bld 152 (*)    BUN 5 (*)    AST 241 (*)    ALT 251 (*)    All other components within normal limits  URINALYSIS, ROUTINE W REFLEX MICROSCOPIC - Abnormal; Notable for the following:    APPearance HAZY (*)    Hgb urine dipstick MODERATE (*)    Ketones, ur 80 (*)    Bacteria, UA RARE (*)    Squamous Epithelial / LPF 0-5 (*)    All other components within normal limits  LIPASE, BLOOD  CBC  HEPATITIS PANEL, ACUTE  POC URINE PREG, ED    EKG  EKG Interpretation None       Radiology US Abdomen Limited Ruq  Result Date: 11/13/2016 CLINICAL DATA:  Elevated liver enzymes. Right upper quadrant pain and vomiting since Friday. EXAM: US ABDOMEN LIMITED - RIGHT UPPER QUADRANT COMPARISON:  None. FINDINGS: Gallbladder: Multiple gallstones, largest measuring 2.2 cm. Gallbladder walls are thickened circumferentially, with  associated pericholecystic fluid/edema. Common bile duct: Diameter: Presumed common bile duct stone measuring 1.6 cm greatest dimension. Surrounding common bile duct not well seen, presumably obscured by overlying bowel. Liver: Small echogenic focus within the right liver lobe measuring 11 mm, too small to definitively characterize, most suggestive of benign hemangioma. Liver otherwise unremarkable. Mild intrahepatic bile duct dilatation within the left liver lobe. IMPRESSION: 1. Gallbladder is distended and gallbladder walls are thickened circumferentially, with surrounding pericholecystic fluid/edema, consistent with acute cholecystitis. Also  multiple gallstones, largest measuring 2.2 cm. 2. Probable stone within the CBD measuring 1.6 cm. Surrounding CBD not well seen, presumably obscured by overlying bowel gas. Consider MRCP or ERCP for confirmation. 3. Associated mild intrahepatic bile duct dilatation within the left liver lobe. Electronically Signed   By: Franki Cabot M.D.   On: 11/13/2016 08:38   Procedures Procedures (including critical care time)  Medications Ordered in ED Medications  ondansetron (ZOFRAN-ODT) disintegrating tablet 4 mg (4 mg Oral Given 11/13/16 0238)  morphine 4 MG/ML injection 4 mg (4 mg Intravenous Given 11/13/16 0717)  sodium chloride 0.9 % bolus 1,000 mL (1,000 mLs Intravenous New Bag/Given 11/13/16 0717)  ondansetron (ZOFRAN) injection 4 mg (4 mg Intravenous Given 11/13/16 0717)   Initial Impression / Assessment and Plan / ED Course  I have reviewed the triage vital signs and the nursing notes.  Pertinent labs & imaging results that were available during my care of the patient were reviewed by me and considered in my medical decision making (see chart for details).  Final Clinical Impressions(s) / ED Diagnoses  {I have reviewed and evaluated the relevant laboratory values. {I have reviewed and evaluated the relevant imaging studies.  {I have reviewed the relevant  previous healthcare records.  {I obtained HPI from historian.   ED Course:  Assessment: Pt is a 47 y.o. female who presents with abdominal pain as well as N/V x 2 days. Notes decrease in PO due to nausea and emesis. On exam, pt in NAD. Nontoxic/nonseptic appearing. VSS. Afebrile. Lungs CTA. Heart RRR. Abdomen TTP RUQ/Epigastric region. CBC without leukocytosis. CMP with elevated LFTs. Concern for possible CBD obstruction vs hepatitis. Hepatitis Panel drawn. RUQ US shows acute cholecystitis with multiple gall stones. Probable 1.6cm stone in CBD. Given analgesia and antiemetics in ED. Of note, pt last took Xarelto yesterday at 7pm. Consult to General Surgery (Dr. Redmond Pulling). Will consult GI ([facility]) as well due to stone in CBD. Admit to medicine.   Disposition/Plan:  Admit Pt acknowledges and agrees with plan  Supervising Physician Jola Schmidt, MD  Final diagnoses:  RUQ pain  Acute cholecystitis  Choledocholithiasis    New Prescriptions New Prescriptions   No medications on file     Conni Slipper 11/13/16 [place], Kevin, MD 11/14/16 [phone]

## 2016-11-14 ENCOUNTER — Encounter (HOSPITAL_COMMUNITY): Payer: Self-pay | Admitting: General Practice

## 2016-11-14 ENCOUNTER — Inpatient Hospital Stay (HOSPITAL_COMMUNITY): Payer: BC Managed Care – PPO

## 2016-11-14 ENCOUNTER — Encounter (HOSPITAL_COMMUNITY): Payer: BC Managed Care – PPO

## 2016-11-14 LAB — COMPREHENSIVE METABOLIC PANEL
ALBUMIN: 3.2 g/dL — AB (ref 3.5–5.0)
ALT: 349 U/L — ABNORMAL HIGH (ref 14–54)
ANION GAP: 9 (ref 5–15)
AST: 212 U/L — ABNORMAL HIGH (ref 15–41)
Alkaline Phosphatase: 89 U/L (ref 38–126)
BUN: 6 mg/dL (ref 6–20)
CALCIUM: 8.7 mg/dL — AB (ref 8.9–10.3)
CO2: 23 mmol/L (ref 22–32)
Chloride: 102 mmol/L (ref 101–111)
Creatinine, Ser: 0.96 mg/dL (ref 0.44–1.00)
GFR calc non Af Amer: >60 mL/min (ref >60)
GLUCOSE: 138 mg/dL — AB (ref 65–99)
Potassium: 3.5 mmol/L (ref 3.5–5.1)
SODIUM: 134 mmol/L — AB (ref 135–145)
TOTAL PROTEIN: 6.7 g/dL (ref 6.5–8.1)
Total Bilirubin: 1.6 mg/dL — ABNORMAL HIGH (ref 0.3–1.2)

## 2016-11-14 LAB — CBC
HEMATOCRIT: 35.5 % — AB (ref 36.0–46.0)
Hemoglobin: 11.8 g/dL — ABNORMAL LOW (ref 12.0–15.0)
MCH: 26.2 pg (ref 26.0–34.0)
MCHC: 33.2 g/dL (ref 30.0–36.0)
MCV: 78.7 fL (ref 78.0–100.0)
Platelets: 303 10*3/uL (ref 150–400)
RBC: 4.51 MIL/uL (ref 3.87–5.11)
RDW: 14.1 % (ref 11.5–15.5)
WBC: 15.5 10*3/uL — AB (ref 4.0–10.5)

## 2016-11-14 LAB — PROTIME-INR
INR: 1.56
Prothrombin Time: 18.8 seconds — ABNORMAL HIGH (ref 11.4–15.2)

## 2016-11-14 LAB — GLUCOSE, CAPILLARY: Glucose-Capillary: 140 mg/dL — ABNORMAL HIGH (ref 65–99)

## 2016-11-14 LAB — APTT
APTT: 74 s — AB (ref 24–36)
APTT: 55 s — AB (ref 24–36)
APTT: 48 s — AB (ref 24–36)

## 2016-11-14 LAB — HIV ANTIBODY (ROUTINE TESTING W REFLEX): HIV Screen 4th Generation wRfx: NONREACTIVE

## 2016-11-14 MED ORDER — ACETAMINOPHEN 325 MG PO TABS
650.0000 mg | ORAL_TABLET | Freq: Four times a day (QID) | ORAL | Status: DC | PRN
  Administered 2016-11-14: 650 mg via ORAL
  Filled 2016-11-14: qty 2

## 2016-11-14 NOTE — Progress Notes (Signed)
Family Medicine Teaching Service Daily Progress Note Intern Pager: [phone]  Patient name: Carla Alexander Medical record number: [id number] Date of birth: [birth year] Age: 46 y.o. Gender: female  Primary Care Provider: Steve Rattler, DO Consultants: GI, Surgery Code Status: FULL CODE   Pt Overview and Major Events to Date:  Carla Alexander is a 46yo woman with acute cholecystitis and choledocholithiasis who has a PMH significant for DVT (on Xarelto last dose 5/12), anemia, panic attacks and left breast mass.GI confirmed diagnosis with MRCP. Cholecystectomy vs ERCP on Tuesday or Wednesday after 48hrs off Xarelto. On heparin drip currently. Transaminitis ongoing.   Assessment and Plan: Carla Alexander is a 46yo woman with acute cholecystitis and choledocholithiasis (1.6cm stone in cystic duct) who has a PMH significant for DVT (on Xarelto last dose 5/12), anemia, panic attacks and left breast mass.   Acute cholecystitis and choledocholithiasis: RUQ [place] and MRCP confirmed diagnosis with 1.6cm stone in cystic duct. Continues to be afebrile. Leukocytosis 15.5, AST 212, ALT 349, Tbili 1.6. PT 16.7 (17.8 a year ago, so possibly baseline) INR normal.  - Per GI, no current signs of ascending cholangitis.  - Cholecystectomy 48 hr post Xarelto - 11/15/2016.  - Zosyn (5/14-) - IV morphine 2 mg Q2 PRN for pain control   - Zofran PRN for nausea  - Follow up Hepatitis panel and follow LFTs - Full liquid diet   History of DVT.  On Xarelto for the last 4 years.  [facility]/12 [facility].  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 - Holding home Xarelto.   - Heparin gtt - Dopplers   Right foot swelling: calf circumferences 41 cm bilaterally. Negative Homans signs. No apparent erythema. Noted some swelling over the dorsal aspect of her right foot. She is tender to palpation over the head of the third metatarsal bone.  -DG right foot.   Elevated blood glucose 138 and 152. Family history  of T2DM - A1c - BID CBGs  Vit D deficiency.  At home on daily cholecalciferol.  -Held on admission   Weight loss.  Patient at home on phentermine 37.5 mg daily.  -Hold phentermine   FEN/GI: Full liquid diet  Prophylaxis: Heparin gtt  Disposition: Continue admission. Full liquid diet until surgery planned, then NPO night before.   Subjective:  Resting comfortably in bed, eating liquid diet. Aware of plan for surgery tomorrow.   Objective: Temp:  [99 F (37.2 C)-99.8 F (37.7 C)] 99 F (37.2 C) (05/14 0407) Pulse Rate:  [73-91] 91 (05/14 0407) Resp:  [17-18] 17 (05/14 0407) BP: (116-157)/(71-87) 116/71 (05/14 0407) SpO2:  [96 %-100 %] 100 % (05/14 0407) Weight:  [242 lb 9.6 oz (110 kg)] 242 lb 9.6 oz (110 kg) (05/13 1600)  Physical Exam: General: Well appearing, lady resting in bed. Daughter at bedside. Cardiovascular: RRR, no murmurs or gallops Respiratory: CTAB no wheezes or crackles Abdomen: Tender to palpation throughout, RUQ especially tender to palpation. Positive Murphy's sign Extremities: Right foot 2+ edema over the dorsal aspect. Tender to palpation over the the head of the third metatarsal bone. Negative Homan's sign bilaterally. Calf circumference 41 cm bilaterally  Laboratory:  Recent Labs Lab 11/13/16 0241 11/14/16 0430  WBC 10.4 15.5*  HGB 12.8 11.8*  HCT 38.7 35.5*  PLT 348 303    Recent Labs Lab 11/13/16 0241 11/14/16 0430  NA 134* 134*  K 3.6 3.5  CL 104 102  CO2 21* 23  BUN 5* 6  CREATININE 0.74 0.96  CALCIUM 9.3 8.7*  PROT 7.6 6.7  BILITOT 0.8 1.6*  ALKPHOS 65 89  ALT 251* 349*  AST 241* 212*  GLUCOSE 152* 138*    Imaging/Diagnostic Tests: RUQ US IMPRESSION: 1. Gallbladder is distended and gallbladder walls are thickened circumferentially, with surrounding pericholecystic fluid/edema, consistent with acute cholecystitis. Also multiple gallstones, largest measuring 2.2 cm. 2. Probable stone within the CBD measuring 1.6 cm.  Surrounding CBD not well seen, presumably obscured by overlying bowel gas. Consider MRCP or ERCP for confirmation. 3. Associated mild intrahepatic bile duct dilatation within the left liver lobe.   MRCP w/wo contrast Cholelithiasis, including a 1.6 cm stone in the gallbladder neck (series 10/ image 16). Associated gallbladder wall thickening/ pericholecystic fluid, suggesting acute cholecystitis.  No intrahepatic or extrahepatic ductal dilatation. Common duct measures 4 mm (series 8/image 21). No choledocholithiasis is seen.  Other: Mild perihepatic and perisplenic ascites. Additional fluid along the hepatic flexure of the colon.  IMPRESSION: Cholelithiasis with acute cholecystitis.  No intrahepatic or extrahepatic ductal dilatation. Common duct measures 4 mm. No choledocholithiasis is seen.  Reactive upper abdominal ascites. Karl Pock, Medical Student 11/14/2016, 8:14 AM MS4, [place] Intern pager: [phone], text pages welcome  I have seen and evaluated the patient with Student Dr. Karrie Meres. I am in agreement with the note above in its revised form.   Wendee Beavers, MD, PGY-2 11/14/2016 3:25 PM

## 2016-11-14 NOTE — Care Management Note (Signed)
Case Management Note  Patient Details  Name: MEHA VIDRINE MRN: [id number] Date of Birth: 1970/10/26  Subjective/Objective:                    Action/Plan:  Possible  laparoscopic cholecystectomy 11-15-16 Expected Discharge Date:                  Expected Discharge Plan:  Home/Self Care  In-House Referral:     Discharge planning Services     Post Acute Care Choice:    Choice offered to:     DME Arranged:    DME Agency:     HH Arranged:    [place] Agency:     Status of Service:  In process, will continue to follow  If discussed at Long Length of Stay Meetings, dates discussed:    Additional Comments:  Marilu Favre, RN 11/14/2016, 11:22 AM

## 2016-11-14 NOTE — Progress Notes (Signed)
ANTICOAGULATION CONSULT NOTE - FOLLOW UP  Pharmacy Consult:  Heparin (Xarelto PTA) Indication:  History of DVT    No Known Allergies  Patient Measurements: Height: 5\' 8"  (172.7 cm) Weight: 242 lb 9.6 oz (110 kg) IBW/kg (Calculated) : 63.9 Heparin Dosing Weight: 89 kg  Vital Signs: Temp: 99 F (37.2 C) (05/14 0407) Temp Source: Oral (05/14 0407) BP: 116/71 (05/14 0407) Pulse Rate: 91 (05/14 0407)  Labs:  Recent Labs  11/13/16 0241 11/13/16 1325 11/14/16 0430 11/14/16 1152  HGB 12.8  --  11.8*  --   HCT 38.7  --  35.5*  --   PLT 348  --  303  --   APTT  --  38* 74* 55*  LABPROT  --  16.7* 18.8*  --   INR  --  1.35 1.56  --   HEPARINUNFRC  --  1.24*  --   --   CREATININE 0.74  --  0.96  --     Estimated Creatinine Clearance: 95.1 mL/min (by C-G formula based on SCr of 0.96 mg/dL).   Assessment: 38 YOF with history of DVT in 2011 and 2012 on chronic Xarelto.  Patient was transitioned to IV heparin for possible endoscopic intervention/ERCP (last Xarelto dose 5/12 at 1900).  Currently using aPTT to guide heparin dosing since Xarelto is falsely elevating heparin levels.  APTT decreased to sub-therapeutic level.  Patient's heparin infusion was off for ~30 min because the patient was showering, so the reported aPTT is inaccurate.  No bleeding reported.   Goal of Therapy:  Heparin level 0.3-0.7 units/ml aPTT 66-102 seconds Monitor platelets by anticoagulation protocol: Yes    Plan:  Continue heparin gtt at 1300 units/hr.  Hold after midnight per MD note. Check 6 hr heparin level Daily aPTT, heparin level, CBC    Ka Flammer D. Mina Marble, PharmD, BCPS Pager:  [phone] 11/14/2016, 1:24 PM

## 2016-11-14 NOTE — Progress Notes (Signed)
ANTICOAGULATION CONSULT NOTE - FOLLOW UP  Pharmacy Consult:  Heparin (Xarelto PTA) Indication:  History of DVT    No Known Allergies  Patient Measurements: Height: 5\' 8"  (172.7 cm) Weight: 242 lb 9.6 oz (110 kg) IBW/kg (Calculated) : 63.9 Heparin Dosing Weight: 89 kg  Vital Signs: Temp: 99 F (37.2 C) (05/14 1759) Temp Source: Oral (05/14 1759) BP: 124/65 (05/14 1534) Pulse Rate: 90 (05/14 1534)  Labs:  Recent Labs  11/13/16 0241  11/13/16 1325 11/14/16 0430 11/14/16 1152 11/14/16 1807  HGB 12.8  --   --  11.8*  --   --   HCT 38.7  --   --  35.5*  --   --   PLT 348  --   --  303  --   --   APTT  --   < > 38* 74* 55* 48*  LABPROT  --   --  16.7* 18.8*  --   --   INR  --   --  1.35 1.56  --   --   HEPARINUNFRC  --   --  1.24*  --   --   --   CREATININE 0.74  --   --  0.96  --   --   < > = values in this interval not displayed.  Estimated Creatinine Clearance: 95.1 mL/min (by C-G formula based on SCr of 0.96 mg/dL).   Assessment: 20 YOF with history of DVT in 2011 and 2012 on chronic Xarelto.  Patient was transitioned to IV heparin for possible endoscopic intervention/ERCP (last Xarelto dose 5/12 at 1900).  Currently using aPTT to guide heparin dosing since Xarelto is falsely elevating heparin levels.  APTT decreased to sub-therapeutic level.   PM PTT low at 48 seconds   Goal of Therapy:  Heparin level 0.3-0.7 units/ml aPTT 66-102 seconds Monitor platelets by anticoagulation protocol: Yes    Plan:  Heparin to 1450 units / hr (heparin off at midnight) Follow up after surgery 5/15  Thank you Anette Guarneri, PharmD [phone]  11/14/2016, 7:20 PM

## 2016-11-14 NOTE — Progress Notes (Signed)
MD paged due to patient having a fever of 101.1. Awaiting a return call.

## 2016-11-14 NOTE — Progress Notes (Signed)
  PCP social note   Called Ms. Pung to see how she is doing. She feels a little better. She is hopeful surgery will take care of the problem. Has no needs, appreciates care from inpatient team. Will see her for follow up once she is discharged.   Lucila Maine, DO PGY-1, [facility] 11/14/2016 10:42 AM

## 2016-11-14 NOTE — Progress Notes (Signed)
Subjective: Patient was seen and examined at bedside. She states that the abdominal pain has decreased in intensity from 10 out of 10 yesterday, to 4 out of 10 today. She experienced some mild nausea in the morning but has not had any episodes of vomiting. She has been able to tolerate clear fluids such as JellO.   Objective: Vital signs in last 24 hours: Temp:  [99 F (37.2 C)-99.8 F (37.7 C)] 99 F (37.2 C) (05/14 0407) Pulse Rate:  [73-91] 91 (05/14 0407) Resp:  [17-18] 17 (05/14 0407) BP: (116-157)/(71-87) 116/71 (05/14 0407) SpO2:  [96 %-100 %] 100 % (05/14 0407) Weight:  [110 kg (242 lb 9.6 oz)] 110 kg (242 lb 9.6 oz) (05/13 1600) Weight change:  Last BM Date: 11/12/16  PE: Comfortable, not in acute distress, no pallor or icterus GENERAL: Speaking in full sentences, not using the accessory muscles of respiration  ABDOMEN: Mild right upper quadrant tenderness, no rebound, no guarding, no rigidity, normoactive bowel sounds EXTREMITIES: No edema, no deformity  Lab Results: Results for orders placed or performed during the hospital encounter of 11/13/16 (from the past 48 hour(s))  Lipase, blood     Status: None   Collection Time: 11/13/16  2:41 AM  Result Value Ref Range   Lipase 39 11 - 51 U/L  Comprehensive metabolic panel     Status: Abnormal   Collection Time: 11/13/16  2:41 AM  Result Value Ref Range   Sodium 134 (L) 135 - 145 mmol/L   Potassium 3.6 3.5 - 5.1 mmol/L   Chloride 104 101 - 111 mmol/L   CO2 21 (L) 22 - 32 mmol/L   Glucose, Bld 152 (H) 65 - 99 mg/dL   BUN 5 (L) 6 - 20 mg/dL   Creatinine, Ser 0.74 0.44 - 1.00 mg/dL   Calcium 9.3 8.9 - 10.3 mg/dL   Total Protein 7.6 6.5 - 8.1 g/dL   Albumin 3.9 3.5 - 5.0 g/dL   AST 241 (H) 15 - 41 U/L   ALT 251 (H) 14 - 54 U/L   Alkaline Phosphatase 65 38 - 126 U/L   Total Bilirubin 0.8 0.3 - 1.2 mg/dL   GFR calc non Af Amer >60 >60 mL/min   GFR calc Af Amer >60 >60 mL/min    Comment: (NOTE) The eGFR has been  calculated using the CKD EPI equation. This calculation has not been validated in all clinical situations. eGFR's persistently <60 mL/min signify possible Chronic Kidney Disease.    Anion gap 9 5 - 15  CBC     Status: None   Collection Time: 11/13/16  2:41 AM  Result Value Ref Range   WBC 10.4 4.0 - 10.5 K/uL   RBC 4.91 3.87 - 5.11 MIL/uL   Hemoglobin 12.8 12.0 - 15.0 g/dL   HCT 38.7 36.0 - 46.0 %   MCV 78.8 78.0 - 100.0 fL   MCH 26.1 26.0 - 34.0 pg   MCHC 33.1 30.0 - 36.0 g/dL   RDW 14.1 11.5 - 15.5 %   Platelets 348 150 - 400 K/uL  Urinalysis, Routine w reflex microscopic     Status: Abnormal   Collection Time: 11/13/16  2:51 AM  Result Value Ref Range   Color, Urine YELLOW YELLOW   APPearance HAZY (A) CLEAR   Specific Gravity, Urine 1.027 1.005 - 1.030   pH 5.0 5.0 - 8.0   Glucose, UA NEGATIVE NEGATIVE mg/dL   Hgb urine dipstick MODERATE (A) NEGATIVE   Bilirubin Urine NEGATIVE  NEGATIVE   Ketones, ur 80 (A) NEGATIVE mg/dL   Protein, ur NEGATIVE NEGATIVE mg/dL   Nitrite NEGATIVE NEGATIVE   Leukocytes, UA NEGATIVE NEGATIVE   RBC / HPF 0-5 0 - 5 RBC/hpf   WBC, UA 0-5 0 - 5 WBC/hpf   Bacteria, UA RARE (A) NONE SEEN   Squamous Epithelial / LPF 0-5 (A) NONE SEEN   Mucous PRESENT    Hyaline Casts, UA PRESENT   POC Urine Pregnancy, ED (do NOT order at [facility])     Status: None   Collection Time: 11/13/16  2:59 AM  Result Value Ref Range   Preg Test, Ur NEGATIVE NEGATIVE    Comment:        THE SENSITIVITY OF THIS METHODOLOGY IS >24 mIU/mL   APTT     Status: Abnormal   Collection Time: 11/13/16  1:25 PM  Result Value Ref Range   aPTT 38 (H) 24 - 36 seconds    Comment:        IF BASELINE aPTT IS ELEVATED, SUGGEST PATIENT RISK ASSESSMENT BE USED TO DETERMINE APPROPRIATE ANTICOAGULANT THERAPY.   Protime-INR     Status: Abnormal   Collection Time: 11/13/16  1:25 PM  Result Value Ref Range   Prothrombin Time 16.7 (H) 11.4 - 15.2 seconds   INR 1.35   Heparin level  (unfractionated)     Status: Abnormal   Collection Time: 11/13/16  1:25 PM  Result Value Ref Range   Heparin Unfractionated 1.24 (H) 0.30 - 0.70 IU/mL    Comment: RESULTS CONFIRMED BY MANUAL DILUTION        IF HEPARIN RESULTS ARE BELOW EXPECTED VALUES, AND PATIENT DOSAGE HAS BEEN CONFIRMED, SUGGEST FOLLOW UP TESTING OF ANTITHROMBIN III LEVELS.   Surgical pcr screen     Status: None   Collection Time: 11/13/16  9:29 PM  Result Value Ref Range   MRSA, PCR NEGATIVE NEGATIVE   Staphylococcus aureus NEGATIVE NEGATIVE    Comment:        The Xpert SA Assay (FDA approved for NASAL specimens in patients over 89 years of age), is one component of a comprehensive surveillance program.  Test performance has been validated by [facility] for patients greater than or equal to 51 year old. It is not intended to diagnose infection nor to guide or monitor treatment.   Comprehensive metabolic panel     Status: Abnormal   Collection Time: 11/14/16  4:30 AM  Result Value Ref Range   Sodium 134 (L) 135 - 145 mmol/L   Potassium 3.5 3.5 - 5.1 mmol/L   Chloride 102 101 - 111 mmol/L   CO2 23 22 - 32 mmol/L   Glucose, Bld 138 (H) 65 - 99 mg/dL   BUN 6 6 - 20 mg/dL   Creatinine, Ser 0.96 0.44 - 1.00 mg/dL   Calcium 8.7 (L) 8.9 - 10.3 mg/dL   Total Protein 6.7 6.5 - 8.1 g/dL   Albumin 3.2 (L) 3.5 - 5.0 g/dL   AST 212 (H) 15 - 41 U/L   ALT 349 (H) 14 - 54 U/L   Alkaline Phosphatase 89 38 - 126 U/L   Total Bilirubin 1.6 (H) 0.3 - 1.2 mg/dL   GFR calc non Af Amer >60 >60 mL/min   GFR calc Af Amer >60 >60 mL/min    Comment: (NOTE) The eGFR has been calculated using the CKD EPI equation. This calculation has not been validated in all clinical situations. eGFR's persistently <60 mL/min signify possible Chronic Kidney  Disease.    Anion gap 9 5 - 15  Protime-INR     Status: Abnormal   Collection Time: 11/14/16  4:30 AM  Result Value Ref Range   Prothrombin Time 18.8 (H) 11.4 - 15.2 seconds    INR 1.56   APTT     Status: Abnormal   Collection Time: 11/14/16  4:30 AM  Result Value Ref Range   aPTT 74 (H) 24 - 36 seconds    Comment:        IF BASELINE aPTT IS ELEVATED, SUGGEST PATIENT RISK ASSESSMENT BE USED TO DETERMINE APPROPRIATE ANTICOAGULANT THERAPY.   CBC     Status: Abnormal   Collection Time: 11/14/16  4:30 AM  Result Value Ref Range   WBC 15.5 (H) 4.0 - 10.5 K/uL   RBC 4.51 3.87 - 5.11 MIL/uL   Hemoglobin 11.8 (L) 12.0 - 15.0 g/dL   HCT 35.5 (L) 36.0 - 46.0 %   MCV 78.7 78.0 - 100.0 fL   MCH 26.2 26.0 - 34.0 pg   MCHC 33.2 30.0 - 36.0 g/dL   RDW 14.1 11.5 - 15.5 %   Platelets 303 150 - 400 K/uL    Studies/Results: Mr Abdomen Mrcp W Wo Contast  Result Date: 11/13/2016 CLINICAL DATA:  Abdominal pain x2 days radiating to back, nausea/vomiting. Abnormal gallbladder with possible CBD stone on ultrasound. EXAM: MRI ABDOMEN WITHOUT AND WITH CONTRAST (INCLUDING MRCP) TECHNIQUE: Multiplanar multisequence MR imaging of the abdomen was performed both before and after the administration of intravenous contrast. Heavily T2-weighted images of the biliary and pancreatic ducts were obtained, and three-dimensional MRCP images were rendered by post processing. CONTRAST:  57m MULTIHANCE GADOBENATE DIMEGLUMINE 529 MG/ML IV SOLN COMPARISON:  Right upper quadrant ultrasound dated 11/13/2016. CT abdomen/pelvis dated 10/26/2008. FINDINGS: Lower chest: Lung bases are clear. Hepatobiliary: Liver is within normal limits. No suspicious/enhancing hepatic lesions. Cholelithiasis, including a 1.6 cm stone in the gallbladder neck (series 10/ image 16). Associated gallbladder wall thickening/ pericholecystic fluid, suggesting acute cholecystitis. No intrahepatic or extrahepatic ductal dilatation. Common duct measures 4 mm (series 8/image 21). No choledocholithiasis is seen. Pancreas:  Within normal limits. Spleen:  Within normal limits. Adrenals/Urinary Tract:  Adrenal glands are within normal limits.  Kidneys are within normal limits.  No hydronephrosis. Stomach/Bowel: Stomach is within normal limits. Visualized bowel is unremarkable. Vascular/Lymphatic:  No evidence of abdominal aortic aneurysm. No suspicious abdominal lymphadenopathy. Other: Mild perihepatic and perisplenic ascites. Additional fluid along the hepatic flexure of the colon. Musculoskeletal: No focal osseous lesions. IMPRESSION: Cholelithiasis with acute cholecystitis. No intrahepatic or extrahepatic ductal dilatation. Common duct measures 4 mm. No choledocholithiasis is seen. Reactive upper abdominal ascites. Electronically Signed   By: SJulian HyM.D.   On: 11/13/2016 16:39   [place]dom[place] Limited Ruq  Result Date: 11/13/2016 CLINICAL DATA:  Elevated liver enzymes. Right upper quadrant pain and vomiting since Friday. EXAM: [place]DOM[place] LIMITED - RIGHT UPPER QUADRANT COMPARISON:  None. FINDINGS: Gallbladder: Multiple gallstones, largest measuring 2.2 cm. Gallbladder walls are thickened circumferentially, with associated pericholecystic fluid/edema. Common bile duct: Diameter: Presumed common bile duct stone measuring 1.6 cm greatest dimension. Surrounding common bile duct not well seen, presumably obscured by overlying bowel. Liver: Small echogenic focus within the right liver lobe measuring 11 mm, too small to definitively characterize, most suggestive of benign hemangioma. Liver otherwise unremarkable. Mild intrahepatic bile duct dilatation within the left liver lobe. IMPRESSION: 1. Gallbladder is distended and gallbladder walls are thickened circumferentially, with surrounding pericholecystic fluid/edema, consistent with  acute cholecystitis. Also multiple gallstones, largest measuring 2.2 cm. 2. Probable stone within the CBD measuring 1.6 cm. Surrounding CBD not well seen, presumably obscured by overlying bowel gas. Consider MRCP or ERCP for confirmation. 3. Associated mild intrahepatic bile duct dilatation within the left liver lobe.  Electronically Signed   By: Franki Cabot M.D.   On: 11/13/2016 08:38    Medications: I have reviewed the patient's current medications.  Assessment: Acute cholecystitis with cholelithiasis. Normal intra-hepatic or intrahepatic ductal dilatation,CBD of 4 mm with no choledocholithiasis on MRCP. Abnormal LFTs, likely related to 1.6 cm stone noted in the neck of gallbladder.  Plan: Surgical evaluation for cholecystectomy,last dose of xarelto was on Saturday, currently on heparin. No current indication for ERCP. Continue IV antibiotics, currently on IV Zosyn. We'll sign off please, reconsult GI if further evaluation needed.    Ronnette Juniper 11/14/2016, 8:11 AM   Pager [phone] If no answer or after 5 PM call [phone]

## 2016-11-14 NOTE — H&P (Signed)
RN informed of 2 elevated blood sugars & Patient family history of Diabetes . MD paged for request for [facility] ,

## 2016-11-14 NOTE — Progress Notes (Signed)
ANTICOAGULATION CONSULT NOTE - Follow Up Consult  Pharmacy Consult for Heparin (Xarelto on hold) Indication: Hx DVT  No Known Allergies  Patient Measurements: Height: 5\' 8"  (172.7 cm) Weight: 242 lb 9.6 oz (110 kg) IBW/kg (Calculated) : 63.9  Vital Signs: Temp: 99 F (37.2 C) (05/14 0407) Temp Source: Oral (05/14 0407) BP: 116/71 (05/14 0407) Pulse Rate: 91 (05/14 0407)  Labs:  Recent Labs  11/13/16 0241 11/13/16 1325 11/14/16 0430  HGB 12.8  --  11.8*  HCT 38.7  --  35.5*  PLT 348  --  303  APTT  --  38* 74*  LABPROT  --  16.7* 18.8*  INR  --  1.35 1.56  HEPARINUNFRC  --  1.24*  --   CREATININE 0.74  --  0.96    Estimated Creatinine Clearance: 95.1 mL/min (by C-G formula based on SCr of 0.96 mg/dL).   Assessment: Heparin while Xarelto on hold in anticipation of procedure. Initial aPTT is therapeutic at 74. Using aPTT to dose heparin for now given Xarelto influence on anti-Xa levels.   Goal of Therapy:  Heparin level 0.3-0.7 units/ml aPTT 66-102 seconds Monitor platelets by anticoagulation protocol: Yes   Plan:  -Cont heparin at 1300 units/hr -1200 aPTT  Narda Bonds 11/14/2016,6:04 AM

## 2016-11-14 NOTE — Progress Notes (Signed)
[facility] Progress Note     Subjective: CC: RUQ pain Patient states pain is improved and rates as 4/10 compared to 10/10 previously. Experiencing some nausea w/o vomiting, relieved by antiemetic. Tolerating full liquids. No BM, passing flatus. Ambulating well, no cramping or pain in lower extremities. Denies SOB, chest pain. Patient is afebrile.   Objective: Vital signs in last 24 hours: Temp:  [99 F (37.2 C)-99.8 F (37.7 C)] 99 F (37.2 C) (05/14 0407) Pulse Rate:  [73-91] 91 (05/14 0407) Resp:  [17-18] 17 (05/14 0407) BP: (116-149)/(71-87) 116/71 (05/14 0407) SpO2:  [96 %-100 %] 100 % (05/14 0407) Weight:  [110 kg (242 lb 9.6 oz)] 110 kg (242 lb 9.6 oz) (05/13 1600) Last BM Date: 11/12/16  Intake/Output from previous day: 05/13 0701 - 05/14 0700 In: 1340 [P.O.:340; IV Piggyback:1000] Out: 400 [Urine:400] Intake/Output this shift: No intake/output data recorded.  PE: Gen:  Alert, NAD, pleasant Card:  Regular rate and rhythm. No lower extremity edema.  Pulm:  Normal effort, clear to auscultation bilaterally Abd: Soft, Moderately TTP in RUQ, Non-distended. +BS in all quadrants. No HSM.  Skin: warm and dry, no rashes  Psych: A&Ox3   Lab Results:   Recent Labs  11/13/16 0241 11/14/16 0430  WBC 10.4 15.5*  HGB 12.8 11.8*  HCT 38.7 35.5*  PLT 348 303   BMET  Recent Labs  11/13/16 0241 11/14/16 0430  NA 134* 134*  K 3.6 3.5  CL 104 102  CO2 21* 23  GLUCOSE 152* 138*  BUN 5* 6  CREATININE 0.74 0.96  CALCIUM 9.3 8.7*   PT/INR  Recent Labs  11/13/16 1325 11/14/16 0430  LABPROT 16.7* 18.8*  INR 1.35 1.56   CMP     Component Value Date/Time   NA 134 (L) 11/14/2016 0430   K 3.5 11/14/2016 0430   CL 102 11/14/2016 0430   CO2 23 11/14/2016 0430   GLUCOSE 138 (H) 11/14/2016 0430   BUN 6 11/14/2016 0430   CREATININE 0.96 11/14/2016 0430   CREATININE 0.82 07/10/2014 1654   CALCIUM 8.7 (L) 11/14/2016 0430   PROT 6.7 11/14/2016 0430    ALBUMIN 3.2 (L) 11/14/2016 0430   AST 212 (H) 11/14/2016 0430   ALT 349 (H) 11/14/2016 0430   ALKPHOS 89 11/14/2016 0430   BILITOT 1.6 (H) 11/14/2016 0430   GFRNONAA >60 11/14/2016 0430   GFRNONAA 78 07/03/2013 0920   GFRAA >60 11/14/2016 0430   GFRAA 89 07/03/2013 0920   Lipase     Component Value Date/Time   LIPASE 39 11/13/2016 0241       Studies/Results: Mr Abdomen Mrcp W Wo Contast  Result Date: 11/13/2016 CLINICAL DATA:  Abdominal pain x2 days radiating to back, nausea/vomiting. Abnormal gallbladder with possible CBD stone on ultrasound. EXAM: MRI ABDOMEN WITHOUT AND WITH CONTRAST (INCLUDING MRCP) TECHNIQUE: Multiplanar multisequence MR imaging of the abdomen was performed both before and after the administration of intravenous contrast. Heavily T2-weighted images of the biliary and pancreatic ducts were obtained, and three-dimensional MRCP images were rendered by post processing. CONTRAST:  71mL MULTIHANCE GADOBENATE DIMEGLUMINE 529 MG/ML IV SOLN COMPARISON:  Right upper quadrant ultrasound dated 11/13/2016. CT abdomen/pelvis dated 10/26/2008. FINDINGS: Lower chest: Lung bases are clear. Hepatobiliary: Liver is within normal limits. No suspicious/enhancing hepatic lesions. Cholelithiasis, including a 1.6 cm stone in the gallbladder neck (series 10/ image 16). Associated gallbladder wall thickening/ pericholecystic fluid, suggesting acute cholecystitis. No intrahepatic or extrahepatic ductal dilatation. Common duct measures 4 mm (series 8/image  21). No choledocholithiasis is seen. Pancreas:  Within normal limits. Spleen:  Within normal limits. Adrenals/Urinary Tract:  Adrenal glands are within normal limits. Kidneys are within normal limits.  No hydronephrosis. Stomach/Bowel: Stomach is within normal limits. Visualized bowel is unremarkable. Vascular/Lymphatic:  No evidence of abdominal aortic aneurysm. No suspicious abdominal lymphadenopathy. Other: Mild perihepatic and perisplenic  ascites. Additional fluid along the hepatic flexure of the colon. Musculoskeletal: No focal osseous lesions. IMPRESSION: Cholelithiasis with acute cholecystitis. No intrahepatic or extrahepatic ductal dilatation. Common duct measures 4 mm. No choledocholithiasis is seen. Reactive upper abdominal ascites. Electronically Signed   By: Julian Hy M.D.   On: 11/13/2016 16:39   US Abdomen Limited Ruq  Result Date: 11/13/2016 CLINICAL DATA:  Elevated liver enzymes. Right upper quadrant pain and vomiting since Friday. EXAM: US ABDOMEN LIMITED - RIGHT UPPER QUADRANT COMPARISON:  None. FINDINGS: Gallbladder: Multiple gallstones, largest measuring 2.2 cm. Gallbladder walls are thickened circumferentially, with associated pericholecystic fluid/edema. Common bile duct: Diameter: Presumed common bile duct stone measuring 1.6 cm greatest dimension. Surrounding common bile duct not well seen, presumably obscured by overlying bowel. Liver: Small echogenic focus within the right liver lobe measuring 11 mm, too small to definitively characterize, most suggestive of benign hemangioma. Liver otherwise unremarkable. Mild intrahepatic bile duct dilatation within the left liver lobe. IMPRESSION: 1. Gallbladder is distended and gallbladder walls are thickened circumferentially, with surrounding pericholecystic fluid/edema, consistent with acute cholecystitis. Also multiple gallstones, largest measuring 2.2 cm. 2. Probable stone within the CBD measuring 1.6 cm. Surrounding CBD not well seen, presumably obscured by overlying bowel gas. Consider MRCP or ERCP for confirmation. 3. Associated mild intrahepatic bile duct dilatation within the left liver lobe. Electronically Signed   By: Franki Cabot M.D.   On: 11/13/2016 08:38    Anti-infectives: Anti-infectives    Start     Dose/Rate Route Frequency Ordered Stop   11/13/16 1400  piperacillin-tazobactam (ZOSYN) IVPB 3.375 g  Status:  Discontinued     3.375 g 100 mL/hr over 30  Minutes Intravenous Every 8 hours 11/13/16 1233 11/13/16 1236   11/13/16 1400  piperacillin-tazobactam (ZOSYN) IVPB 3.375 g     3.375 g 12.5 mL/hr over 240 Minutes Intravenous Every 8 hours 11/13/16 1237         Assessment/Plan Acute calculous cholecystitis  - Continue IV abx - Zosyn - leukocytosis - WBC 15.5 from 10.4 yesterday - AST 212 from 241 yesterday, ALT elevated to 349 from 251 - Continue IV morphine 2 mg q2 PRN for pain - Discussed laparoscopic cholecystectomy planned tentatively for tomorrow with Dr. Ninfa Linden   Hx of DVT - on oral anticoagulant, last dose Saturday night (5/12) - Continue to hold xarelto - Currently on heparin - hold after midnight  FEN - SOFT diet, NPO after midnight, PRN antiemetics for nausea.  VTE - see above  LOS: 1 day    Brigid Re , [facility] 11/14/2016, 8:57 AM Pager: [phone] Consults: [phone] Mon-Fri 7:00 am-4:30 pm Sat-Sun 7:00 am-11:30 am

## 2016-11-15 ENCOUNTER — Inpatient Hospital Stay (HOSPITAL_COMMUNITY): Payer: BC Managed Care – PPO | Admitting: Certified Registered Nurse Anesthetist

## 2016-11-15 ENCOUNTER — Encounter (HOSPITAL_COMMUNITY): Payer: Self-pay | Admitting: Certified Registered Nurse Anesthetist

## 2016-11-15 ENCOUNTER — Encounter (HOSPITAL_COMMUNITY)
Admission: EM | Disposition: A | Discharge status: Home / Self Care | Payer: Self-pay | Source: Home / Self Care | Attending: Family Medicine

## 2016-11-15 ENCOUNTER — Encounter (HOSPITAL_COMMUNITY): Payer: BC Managed Care – PPO

## 2016-11-15 HISTORY — PX: CHOLECYSTECTOMY: SHX55

## 2016-11-15 LAB — COMPREHENSIVE METABOLIC PANEL
ALBUMIN: 2.9 g/dL — AB (ref 3.5–5.0)
ALK PHOS: 104 U/L (ref 38–126)
ALT: 322 U/L — ABNORMAL HIGH (ref 14–54)
ANION GAP: 7 (ref 5–15)
AST: 174 U/L — ABNORMAL HIGH (ref 15–41)
BILIRUBIN TOTAL: 0.8 mg/dL (ref 0.3–1.2)
BUN: <5 mg/dL — ABNORMAL LOW (ref 6–20)
CALCIUM: 8.8 mg/dL — AB (ref 8.9–10.3)
CO2: 23 mmol/L (ref 22–32)
CREATININE: 0.76 mg/dL (ref 0.44–1.00)
Chloride: 104 mmol/L (ref 101–111)
GFR calc Af Amer: >60 mL/min (ref >60)
GFR calc non Af Amer: >60 mL/min (ref >60)
GLUCOSE: 125 mg/dL — AB (ref 65–99)
Potassium: 3.1 mmol/L — ABNORMAL LOW (ref 3.5–5.1)
SODIUM: 134 mmol/L — AB (ref 135–145)
TOTAL PROTEIN: 6.3 g/dL — AB (ref 6.5–8.1)

## 2016-11-15 LAB — HEPATITIS PANEL, ACUTE
HEP A IGM: NEGATIVE
HEP B C IGM: NEGATIVE
Hepatitis B Surface Ag: NEGATIVE

## 2016-11-15 LAB — HEPARIN LEVEL (UNFRACTIONATED): HEPARIN UNFRACTIONATED: 0.31 [IU]/mL (ref 0.30–0.70)

## 2016-11-15 LAB — CBC
HEMATOCRIT: 33 % — AB (ref 36.0–46.0)
HEMOGLOBIN: 10.5 g/dL — AB (ref 12.0–15.0)
MCH: 25.2 pg — AB (ref 26.0–34.0)
MCHC: 31.8 g/dL (ref 30.0–36.0)
MCV: 79.1 fL (ref 78.0–100.0)
Platelets: 300 10*3/uL (ref 150–400)
RBC: 4.17 MIL/uL (ref 3.87–5.11)
RDW: 13.8 % (ref 11.5–15.5)
WBC: 11.5 10*3/uL — ABNORMAL HIGH (ref 4.0–10.5)

## 2016-11-15 LAB — APTT: aPTT: 43 seconds — ABNORMAL HIGH (ref 24–36)

## 2016-11-15 LAB — HEMOGLOBIN A1C
Hgb A1c MFr Bld: 5.9 % — ABNORMAL HIGH (ref 4.8–5.6)
MEAN PLASMA GLUCOSE: 123 mg/dL

## 2016-11-15 LAB — GLUCOSE, CAPILLARY
Glucose-Capillary: 108 mg/dL — ABNORMAL HIGH (ref 65–99)
Glucose-Capillary: 175 mg/dL — ABNORMAL HIGH (ref 65–99)

## 2016-11-15 SURGERY — LAPAROSCOPIC CHOLECYSTECTOMY
Anesthesia: General | Site: Abdomen

## 2016-11-15 MED ORDER — POTASSIUM CHLORIDE CRYS ER 20 MEQ PO TBCR
40.0000 meq | EXTENDED_RELEASE_TABLET | Freq: Once | ORAL | Status: AC
  Administered 2016-11-15: 40 meq via ORAL
  Filled 2016-11-15: qty 2

## 2016-11-15 MED ORDER — HEMOSTATIC AGENTS (NO CHARGE) OPTIME
TOPICAL | Status: DC | PRN
  Administered 2016-11-15: 2 via TOPICAL

## 2016-11-15 MED ORDER — HYDROMORPHONE HCL 1 MG/ML IJ SOLN
INTRAMUSCULAR | Status: AC
  Filled 2016-11-15: qty 1

## 2016-11-15 MED ORDER — PROPOFOL 10 MG/ML IV BOLUS
INTRAVENOUS | Status: AC
  Filled 2016-11-15: qty 20

## 2016-11-15 MED ORDER — ROCURONIUM BROMIDE 10 MG/ML (PF) SYRINGE
PREFILLED_SYRINGE | INTRAVENOUS | Status: DC | PRN
  Administered 2016-11-15: 50 mg via INTRAVENOUS

## 2016-11-15 MED ORDER — DEXAMETHASONE SODIUM PHOSPHATE 10 MG/ML IJ SOLN
INTRAMUSCULAR | Status: AC
  Filled 2016-11-15: qty 1

## 2016-11-15 MED ORDER — OXYCODONE HCL 5 MG PO TABS
ORAL_TABLET | ORAL | Status: AC
  Filled 2016-11-15: qty 1

## 2016-11-15 MED ORDER — HYDROMORPHONE HCL 1 MG/ML IJ SOLN
0.2500 mg | INTRAMUSCULAR | Status: DC | PRN
  Administered 2016-11-15: 0.5 mg via INTRAVENOUS

## 2016-11-15 MED ORDER — KETOROLAC TROMETHAMINE 30 MG/ML IJ SOLN
30.0000 mg | Freq: Once | INTRAMUSCULAR | Status: DC | PRN
  Administered 2016-11-15: 30 mg via INTRAVENOUS

## 2016-11-15 MED ORDER — FENTANYL CITRATE (PF) 100 MCG/2ML IJ SOLN
INTRAMUSCULAR | Status: DC | PRN
  Administered 2016-11-15 (×2): 50 ug via INTRAVENOUS
  Administered 2016-11-15: 150 ug via INTRAVENOUS

## 2016-11-15 MED ORDER — MIDAZOLAM HCL 2 MG/2ML IJ SOLN
INTRAMUSCULAR | Status: AC
  Filled 2016-11-15: qty 2

## 2016-11-15 MED ORDER — SUGAMMADEX SODIUM 200 MG/2ML IV SOLN
INTRAVENOUS | Status: AC
  Filled 2016-11-15: qty 2

## 2016-11-15 MED ORDER — PROPOFOL 10 MG/ML IV BOLUS
INTRAVENOUS | Status: DC | PRN
  Administered 2016-11-15: 200 mg via INTRAVENOUS

## 2016-11-15 MED ORDER — SODIUM CHLORIDE 0.9 % IV SOLN: INTRAVENOUS | Status: DC | PRN

## 2016-11-15 MED ORDER — SODIUM CHLORIDE 0.9 % IR SOLN
Status: DC | PRN
  Administered 2016-11-15: 1

## 2016-11-15 MED ORDER — BUPIVACAINE HCL (PF) 0.25 % IJ SOLN
INTRAMUSCULAR | Status: AC
  Filled 2016-11-15: qty 30

## 2016-11-15 MED ORDER — PROMETHAZINE HCL 25 MG/ML IJ SOLN: 6.2500 mg | INTRAMUSCULAR | Status: DC | PRN

## 2016-11-15 MED ORDER — LIDOCAINE 2% (20 MG/ML) 5 ML SYRINGE
INTRAMUSCULAR | Status: DC | PRN
  Administered 2016-11-15: 100 mg via INTRAVENOUS

## 2016-11-15 MED ORDER — ONDANSETRON HCL 4 MG/2ML IJ SOLN
INTRAMUSCULAR | Status: AC
  Filled 2016-11-15: qty 2

## 2016-11-15 MED ORDER — MORPHINE SULFATE (PF) 4 MG/ML IV SOLN
1.0000 mg | INTRAVENOUS | Status: DC | PRN
  Administered 2016-11-15 – 2016-11-16 (×3): 4 mg via INTRAVENOUS
  Filled 2016-11-15 (×3): qty 1

## 2016-11-15 MED ORDER — OXYCODONE HCL 5 MG PO TABS
5.0000 mg | ORAL_TABLET | ORAL | Status: DC | PRN
  Administered 2016-11-15: 5 mg via ORAL
  Administered 2016-11-16 – 2016-11-17 (×7): 10 mg via ORAL
  Filled 2016-11-15 (×7): qty 2

## 2016-11-15 MED ORDER — 0.9 % SODIUM CHLORIDE (POUR BTL) OPTIME
TOPICAL | Status: DC | PRN
  Administered 2016-11-15: 1000 mL

## 2016-11-15 MED ORDER — BUPIVACAINE-EPINEPHRINE 0.25% -1:200000 IJ SOLN
INTRAMUSCULAR | Status: DC | PRN
  Administered 2016-11-15: 20 mL

## 2016-11-15 MED ORDER — FENTANYL CITRATE (PF) 250 MCG/5ML IJ SOLN
INTRAMUSCULAR | Status: AC
  Filled 2016-11-15: qty 5

## 2016-11-15 MED ORDER — IOPAMIDOL (ISOVUE-300) INJECTION 61%
INTRAVENOUS | Status: AC
  Filled 2016-11-15: qty 50

## 2016-11-15 MED ORDER — DEXAMETHASONE SODIUM PHOSPHATE 10 MG/ML IJ SOLN
INTRAMUSCULAR | Status: DC | PRN
  Administered 2016-11-15: 10 mg via INTRAVENOUS

## 2016-11-15 MED ORDER — MIDAZOLAM HCL 5 MG/5ML IJ SOLN
INTRAMUSCULAR | Status: DC | PRN
  Administered 2016-11-15: 2 mg via INTRAVENOUS

## 2016-11-15 MED ORDER — LACTATED RINGERS IV SOLN
INTRAVENOUS | Status: DC | PRN
  Administered 2016-11-15: 09:00:00 via INTRAVENOUS
  Administered 2016-11-15: 11:00:00

## 2016-11-15 MED ORDER — HEPARIN (PORCINE) IN NACL 100-0.45 UNIT/ML-% IJ SOLN
1450.0000 [IU]/h | INTRAMUSCULAR | Status: DC
  Administered 2016-11-15: 1450 [IU]/h via INTRAVENOUS
  Filled 2016-11-15 (×3): qty 250

## 2016-11-15 MED ORDER — MEPERIDINE HCL 25 MG/ML IJ SOLN: 6.2500 mg | INTRAMUSCULAR | Status: DC | PRN

## 2016-11-15 MED ORDER — SUGAMMADEX SODIUM 200 MG/2ML IV SOLN
INTRAVENOUS | Status: DC | PRN
  Administered 2016-11-15: 200 mg via INTRAVENOUS

## 2016-11-15 MED ORDER — DEXTROSE-NACL 5-0.45 % IV SOLN
INTRAVENOUS | Status: DC
  Administered 2016-11-15 – 2016-11-16 (×2): via INTRAVENOUS

## 2016-11-15 MED ORDER — KETOROLAC TROMETHAMINE 30 MG/ML IJ SOLN
INTRAMUSCULAR | Status: AC
Start: 2016-11-15 — End: 2016-11-15
  Filled 2016-11-15: qty 1

## 2016-11-15 MED ORDER — LIDOCAINE 2% (20 MG/ML) 5 ML SYRINGE
INTRAMUSCULAR | Status: AC
  Filled 2016-11-15: qty 5

## 2016-11-15 SURGICAL SUPPLY — 42 items
APPLIER CLIP 5 13 M/L LIGAMAX5 (MISCELLANEOUS) ×4
BLADE CLIPPER SURG (BLADE) IMPLANT
CANISTER SUCT 3000ML PPV (MISCELLANEOUS) ×4 IMPLANT
CHLORAPREP W/TINT 26ML (MISCELLANEOUS) ×4 IMPLANT
CLIP APPLIE 5 13 M/L LIGAMAX5 (MISCELLANEOUS) ×2 IMPLANT
COVER MAYO STAND STRL (DRAPES) ×4 IMPLANT
COVER SURGICAL LIGHT HANDLE (MISCELLANEOUS) ×4 IMPLANT
DERMABOND ADVANCED (GAUZE/BANDAGES/DRESSINGS) ×2
DERMABOND ADVANCED .7 DNX12 (GAUZE/BANDAGES/DRESSINGS) ×2 IMPLANT
DRAPE C-ARM 42X72 X-RAY (DRAPES) ×4 IMPLANT
ELECT REM PT RETURN 9FT ADLT (ELECTROSURGICAL) ×4
ELECTRODE REM PT RTRN 9FT ADLT (ELECTROSURGICAL) ×2 IMPLANT
GLOVE BIOGEL PI IND STRL 7.0 (GLOVE) ×2 IMPLANT
GLOVE BIOGEL PI IND STRL 8.5 (GLOVE) ×4 IMPLANT
GLOVE BIOGEL PI INDICATOR 7.0 (GLOVE) ×2
GLOVE BIOGEL PI INDICATOR 8.5 (GLOVE) ×4
GLOVE SURG SIGNA 7.5 PF LTX (GLOVE) ×4 IMPLANT
GLOVE SURG SS PI 7.0 STRL IVOR (GLOVE) ×4 IMPLANT
GLOVE SURG SS PI 8.0 STRL IVOR (GLOVE) ×4 IMPLANT
GOWN STRL REUS W/ TWL LRG LVL3 (GOWN DISPOSABLE) ×4 IMPLANT
GOWN STRL REUS W/ TWL XL LVL3 (GOWN DISPOSABLE) ×2 IMPLANT
GOWN STRL REUS W/TWL LRG LVL3 (GOWN DISPOSABLE) ×4
GOWN STRL REUS W/TWL XL LVL3 (GOWN DISPOSABLE) ×3
HEMOSTAT SNOW SURGICEL 2X4 (HEMOSTASIS) ×8 IMPLANT
KIT BASIN OR (CUSTOM PROCEDURE TRAY) ×4 IMPLANT
KIT ROOM TURNOVER OR (KITS) ×4 IMPLANT
NS IRRIG 1000ML POUR BTL (IV SOLUTION) ×4 IMPLANT
PAD ARMBOARD 7.5X6 YLW CONV (MISCELLANEOUS) ×4 IMPLANT
POUCH SPECIMEN RETRIEVAL 10MM (ENDOMECHANICALS) ×4 IMPLANT
SCISSORS LAP 5X35 DISP (ENDOMECHANICALS) ×4 IMPLANT
SET CHOLANGIOGRAPH 5 50 .035 (SET/KITS/TRAYS/PACK) ×4 IMPLANT
SET IRRIG TUBING LAPAROSCOPIC (IRRIGATION / IRRIGATOR) ×4 IMPLANT
SLEEVE ENDOPATH XCEL 5M (ENDOMECHANICALS) ×8 IMPLANT
SPECIMEN JAR SMALL (MISCELLANEOUS) ×4 IMPLANT
SUT MNCRL AB 4-0 PS2 18 (SUTURE) ×4 IMPLANT
TOWEL OR 17X24 6PK STRL BLUE (TOWEL DISPOSABLE) ×4 IMPLANT
TOWEL OR 17X26 10 PK STRL BLUE (TOWEL DISPOSABLE) ×4 IMPLANT
TRAY LAPAROSCOPIC MC (CUSTOM PROCEDURE TRAY) ×4 IMPLANT
TROCAR BLADELESS 11MM (ENDOMECHANICALS) ×4 IMPLANT
TROCAR XCEL BLUNT TIP 100MML (ENDOMECHANICALS) ×4 IMPLANT
TROCAR XCEL NON-BLD 5MMX100MML (ENDOMECHANICALS) ×4 IMPLANT
TUBING INSUFFLATION (TUBING) ×4 IMPLANT

## 2016-11-15 NOTE — Progress Notes (Signed)
Family Medicine Teaching Service Daily Progress Note Intern Pager: [phone]  Patient name: Carla Alexander Medical record number: [id number] Date of birth: 07/25/1970 Age: 46 y.o. Gender: female  Primary Care Provider: Steve Rattler, DO Consultants: GI, Surgery Code Status: FULL CODE  Pt Overview and Major Events to Date:  Carla Alexander is a 46yo woman with acute cholecystitis and choledocholithiasis who has a PMH significant for DVT (on Xarelto last dose 5/12), anemia, panic attacks and left breast mass. Started on Zosyn (5/12-) GI confirmed diagnosis with MRCP. On heparin drip currently and monitoring aPTT with goal of 66-102sec. Currently subtherapeutic at 48sec due to being off for 30 min overnight. Febrile 101F yesterday. Cholecystectomy today after 48hrs off Xarelto.   Assessment and Plan: Carla Alexander is a 46yo woman with acute cholecystitis and choledocholithiasis (1.6cm stone in cystic duct) who has a PMH significant for DVT (on Xarelto last dose 5/12), anemia, panic attacks and left breast mass.  Acute cholecystitis and choledocholithiasis: status post laparoscopic cholecystectomy without complication.  Hepatitis panel negative.  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  - ? Resume Xarelto in the morning - Follow up LE Doppler  Hypokalemia: 3.1 - repleted IV - CMP in the morning  Right foot swelling:  tender to palpation over the head of the third metatarsal bone. DG showing no fracture. Symmetric calf circumference. Commands negative - Follow up LE Dopplers - Encourage foot elevation.  Prediabetes:  A1c 5.9 - BID CBGs - Diabetes prevention education  Vit D deficiency. At home on daily cholecalciferol.  -Held on admission   FEN/GI:  -Resume diet  Prophylaxis: Heparin gtt. We'll start Xarelto in the  morning  Disposition: continue inpatient pending clinical improvement. May go home 5/16  Subjective:  Patient had a laparoscopic cholecystectomy this morning. Pain improved. Denies chest pain, shortness of breath. Right foot is still swollen. No other complaints.   Objective: Temp:  [98.9 F (37.2 C)-101.1 F (38.4 C)] 98.9 F (37.2 C) (05/15 0704) Pulse Rate:  [88-90] 88 (05/15 0704) Resp:  [18-19] 19 (05/15 0704) BP: (108-124)/(64-69) 113/64 (05/15 0704) SpO2:  [100 %] 100 % (05/15 0704) Physical Exam:  General: Well appearing, lady resting in bed. Daughter at bedside. Cardiovascular: RRR, no murmurs or gallops Respiratory: CTAB no wheezes or crackles Abdomen: lap chole sites clean. BS+. Mildly tender to palpation over RUQ Extremities: Right foot 2+ edema over the dorsal aspect. Tender to palpation over the the head of the third metatarsal bone. Negative Homan's sign bilaterally. Calf circumference symmetric  Laboratory:  Recent Labs Lab 11/13/16 0241 11/14/16 0430 11/15/16 0346  WBC 10.4 15.5* 11.5*  HGB 12.8 11.8* 10.5*  HCT 38.7 35.5* 33.0*  PLT 348 303 300    Recent Labs Lab 11/13/16 0241 11/14/16 0430 11/15/16 0346  NA 134* 134* 134*  K 3.6 3.5 3.1*  CL 104 102 104  CO2 21* 23 23  BUN 5* 6 <5*  CREATININE 0.74 0.96 0.76  CALCIUM 9.3 8.7* 8.8*  PROT 7.6 6.7 6.3*  BILITOT 0.8 1.6* 0.8  ALKPHOS 65 89 104  ALT 251* 349* 322*  AST 241* 212* 174*  GLUCOSE 152* 138* 125*   Imaging/Diagnostic Tests:  DG Foot FINDINGS: There is no evidence of fracture or dislocation. Moderate plantar calcaneal spur. IMPRESSION: No acute osseous abnormality  Carla Alexander, Medical Student 11/15/2016, 8:00 AM MS4, [place] Intern pager: [phone], text pages welcome  I have seen and evaluated the patient with Student Dr. Karrie Meres I am in agreement with the note above in its revised form.   Wendee Beavers, MD, PGY-2 11/15/2016 4:04 PM

## 2016-11-15 NOTE — Progress Notes (Addendum)
Pt is A&O x4, NPO maint,  to pre op via bed.  1210 Received pt back from PACU, groggy. 3 lap sites with skin adhesives dry and intact.  1600 Pt tolerating clear liquid diet. Pain is controlled.

## 2016-11-15 NOTE — Transfer of Care (Signed)
Immediate Anesthesia Transfer of Care Note  Patient: DEYONA SOZA  Procedure(s) Performed: Procedure(s): LAPAROSCOPIC CHOLECYSTECTOMY (N/A)  Patient Location: PACU  Anesthesia Type:General  Level of Consciousness: awake, alert  and oriented  Airway & Oxygen Therapy: Patient Spontanous Breathing and Patient connected to nasal cannula oxygen  Post-op Assessment: Report given to RN and Post -op Vital signs reviewed and stable  Post vital signs: Reviewed and stable  Last Vitals:  Vitals:   11/14/16 2022 11/15/16 0704  BP: 108/69 113/64  Pulse: 88 88  Resp: 18 19  Temp: 37.4 C 37.2 C    Last Pain:  Vitals:   11/15/16 0704  TempSrc: Oral  PainSc:          Complications: No apparent anesthesia complications

## 2016-11-15 NOTE — Progress Notes (Signed)
FMTS Attending Daily Note:  Carla Netters MD Personal pager:  [phone] FPTS Service Pager:  [phone]  I have seen and examined this patient and have reviewed their chart. I have discussed this patient with the resident. I agree with the resident's findings, assessment and care plan per Dr. Juliann Pares progress note on 5/15.  Leeanne Rio, MD 11/15/2016

## 2016-11-15 NOTE — Op Note (Signed)
LAPAROSCOPIC CHOLECYSTECTOMY  Procedure Note  Carla Alexander 11/13/2016 - 11/15/2016   Pre-op Diagnosis: ACUTE CHOLECYSTITIS WITH CHOLELITHIASIS     Post-op Diagnosis: SAME  Procedure(s): LAPAROSCOPIC CHOLECYSTECTOMY  Surgeon(s): Coralie Keens, MD  Anesthesia: General  Staff:  Circulator: Cyd Silence, RN Scrub Person: Adella Hare; Thacker, Amy J, RN  Estimated Blood Loss: less than 100 mL               Specimens: sent to path  Findings: The patient was found to have an acutely inflamed gallbladder with omentum stuck to it. It was distended and had significant cholecystitis  Procedure: The patient was brought to the operating room and identified as the correct patient. She was placed supine on the operating table and general anesthesia was induced. Her abdomen was prepped and draped in the usual sterile fashion. I made a small vertical incision just below the umbilicus.  I took this down to the fascia which was then over the scalpel. Hemostat was then used to pass into the peritoneal cavity. A 0 Vicryl pursing suture was placed around the fascia opening. The [facility] port was placed through the opening and insufflation of the abdomen was begun. The patient does not have omentum stuck to the midline. She was also found to have an acutely inflamed gallbladder which was distended and had a large amount of omentum stuck on top of it as well. I placed a 5 mm trocar and the patient's epigastrium and 2 more in the right upper quadrant after incisions with a scalpel and under direct vision. I then had to slowly peel the omentum off the gallbladder with electrocautery. The gallbladder was too distended to grasp so I had to aspirate bile from the gallbladder. The gallbladder itself was quite friable. Dissection was carried out at the base of the gallbladder. I was able to identify a very foreshortened cystic duct. I achieved a critical and around. I decided to forego cholangiogram  because of the length of the cystic duct. I clipped it several times proximally and once distally and transected it. Identified the cystic artery and clipped it proximally and distally and transected as well. The gallbladder was then slowly dissected free from the liver bed with electrocautery. Once it was free from liver bed I placed a couple pieces of surgical snow into the gallbladder fossa and hemostasis appeared to be achieved. I placed the gallbladder in an Endosac and removed into the incision at the umbilicus. I then thoroughly irrigated the abdomen with several liters of normal saline. Again hemostasis appeared to be achieved. I then closed the umbilical incision with 0 Vicryl suture and another figure-of-eight 0 Vicryl suture as well. All ports were then removed under direct vision and the abdomen was deflated. All incisions were anesthetized with  Marcaine and closed with 4-0 Monocryl subcuticular sutures.  Dermabond was then applied. The patient tolerated the procedure well. All the counts were correct at the end of the procedure. The patient was then extubated in the operating room and taken in a stable condition to the recovery room.          Carla Alexander A   Date: 11/15/2016  Time: 10:57 AM

## 2016-11-15 NOTE — Progress Notes (Signed)
Patient ID: Carla Alexander, female   DOB: Mar 16, 1971, 46 y.o.   MRN: [id number]  Pre Procedure note for inpatients:   Carla Alexander has been scheduled for Procedure(s): LAPAROSCOPIC CHOLECYSTECTOMY WITH INTRAOPERATIVE CHOLANGIOGRAM (N/A) today. The various methods of treatment have been discussed with the patient. After consideration of the risks, benefits and treatment options the patient has consented to the planned procedure.   The patient has been seen and labs reviewed. There are no changes in the patient's condition to prevent proceeding with the planned procedure today.  Recent labs:  Lab Results  Component Value Date   WBC 11.5 (H) 11/15/2016   HGB 10.5 (L) 11/15/2016   HCT 33.0 (L) 11/15/2016   PLT 300 11/15/2016   GLUCOSE 125 (H) 11/15/2016   CHOL 169 03/22/2007   TRIG 86 03/22/2007   HDL 35 (L) 03/22/2007   LDLDIRECT 142 (H) 07/03/2013   LDLCALC 117 (H) 03/22/2007   ALT 322 (H) 11/15/2016   AST 174 (H) 11/15/2016   NA 134 (L) 11/15/2016   K 3.1 (L) 11/15/2016   CL 104 11/15/2016   CREATININE 0.76 11/15/2016   BUN <5 (L) 11/15/2016   CO2 23 11/15/2016   TSH 0.940 07/10/2014   INR 1.56 11/14/2016   HGBA1C 5.9 (H) 11/14/2016    Joshiah Traynham A, MD 11/15/2016 8:18 AM

## 2016-11-15 NOTE — Anesthesia Procedure Notes (Signed)
Procedure Name: Intubation Date/Time: 11/15/2016 9:44 AM Performed by: Garrison Columbus T Pre-anesthesia Checklist: Patient identified, Emergency Drugs available, Suction available and Patient being monitored Patient Re-evaluated:Patient Re-evaluated prior to inductionOxygen Delivery Method: Circle System Utilized Preoxygenation: Pre-oxygenation with 100% oxygen Intubation Type: IV induction Ventilation: Mask ventilation without difficulty and Oral airway inserted - appropriate to patient size Laryngoscope Size: Sabra Heck and 2 Grade View: Grade I Tube type: Oral Tube size: 7.0 mm Number of attempts: 1 Airway Equipment and Method: Stylet and Oral airway Placement Confirmation: ETT inserted through vocal cords under direct vision,  positive ETCO2 and breath sounds checked- equal and bilateral Secured at: 22 cm Tube secured with: Tape Dental Injury: Teeth and Oropharynx as per pre-operative assessment

## 2016-11-15 NOTE — Anesthesia Preprocedure Evaluation (Addendum)
Anesthesia Evaluation  Patient identified by MRN, date of birth, ID band Patient awake    Reviewed: Allergy & Precautions, NPO status , Patient's Chart, lab work & pertinent test results  Airway Mallampati: II  TM Distance: >3 FB Neck ROM: Full    Dental no notable dental hx. (+) Teeth Intact, Dental Advisory Given   Pulmonary neg pulmonary ROS,    Pulmonary exam normal breath sounds clear to auscultation       Cardiovascular + DVT  Normal cardiovascular exam Rhythm:Regular Rate:Normal     Neuro/Psych PSYCHIATRIC DISORDERS Anxiety    GI/Hepatic Neg liver ROS,   Endo/Other  negative endocrine ROS  Renal/GU negative Renal ROS  negative genitourinary   Musculoskeletal   Abdominal (+) + obese,   Peds  Hematology   Anesthesia Other Findings   Reproductive/Obstetrics negative OB ROS                           Anesthesia Physical Anesthesia Plan  ASA: II  Anesthesia Plan: General   Post-op Pain Management:    Induction: Intravenous  Airway Management Planned: Oral ETT  Additional Equipment:   Intra-op Plan:   Post-operative Plan: Extubation in OR  Informed Consent: I have reviewed the patients History and Physical, chart, labs and discussed the procedure including the risks, benefits and alternatives for the proposed anesthesia with the patient or authorized representative who has indicated his/her understanding and acceptance.   Dental advisory given  Plan Discussed with: CRNA and Surgeon  Anesthesia Plan Comments:         Anesthesia Quick Evaluation

## 2016-11-15 NOTE — Progress Notes (Signed)
ANTICOAGULATION CONSULT NOTE - FOLLOW UP  Pharmacy Consult:  Heparin (Xarelto PTA) Indication:  History of DVT    No Known Allergies  Patient Measurements: Height: 5\' 8"  (172.7 cm) Weight: 242 lb 9.6 oz (110 kg) IBW/kg (Calculated) : 63.9 Heparin Dosing Weight: 89 kg  Vital Signs: Temp: 99.2 F (37.3 C) (05/15 1210) Temp Source: Oral (05/15 1210) BP: 133/90 (05/15 1210) Pulse Rate: 86 (05/15 1210)  Labs:  Recent Labs  11/13/16 0241  11/13/16 1325 11/14/16 0430 11/14/16 1152 11/14/16 1807 11/15/16 0346  HGB 12.8  --   --  11.8*  --   --  10.5*  HCT 38.7  --   --  35.5*  --   --  33.0*  PLT 348  --   --  303  --   --  300  APTT  --   < > 38* 74* 55* 48* 43*  LABPROT  --   --  16.7* 18.8*  --   --   --   INR  --   --  1.35 1.56  --   --   --   HEPARINUNFRC  --   --  1.24*  --   --   --  0.31  CREATININE 0.74  --   --  0.96  --   --  0.76  < > = values in this interval not displayed.  Estimated Creatinine Clearance: 114.2 mL/min (by C-G formula based on SCr of 0.76 mg/dL).   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  Patient was transitioned to IV heparin for possible endoscopic intervention (last Xarelto dose 5/12 at 1900).  Currently using aPTT to guide heparin dosing since Xarelto is falsely elevating heparin levels.  APTT was sub-therapeutic at 48 seconds last night and heparin gtt was increased.  APTT level this AM is inaccurate because heparin was off since midnight.  Now s/p lap chole and Pharmacy to resume heparin gtt at 1800.   Goal of Therapy:  Heparin level 0.3-0.7 units/ml aPTT 66-102 seconds Monitor platelets by anticoagulation protocol: Yes    Plan:  - At 1800, resume heparin gtt at 1450 units/hr - Check aPTT 6 hrs post resumption - Daily aPTT, heparin level, and CBC - Monitor closely for s/sx of bleeding    Carla Alexander D. Mina Marble, PharmD, BCPS Pager:  [phone] 11/15/2016, 1:08 PM

## 2016-11-15 NOTE — Progress Notes (Addendum)
Patient reports that big toe on right foot is swelling; minor swelling to right big toe. Dopplers are already ordered and patient is awaiting testing.

## 2016-11-16 ENCOUNTER — Encounter (HOSPITAL_COMMUNITY): Payer: Self-pay | Admitting: Surgery

## 2016-11-16 ENCOUNTER — Inpatient Hospital Stay (HOSPITAL_COMMUNITY): Payer: BC Managed Care – PPO

## 2016-11-16 DIAGNOSIS — K810 Acute cholecystitis: Secondary | ICD-10-CM

## 2016-11-16 LAB — COMPREHENSIVE METABOLIC PANEL
ALT: 238 U/L — AB (ref 14–54)
AST: 73 U/L — AB (ref 15–41)
Albumin: 3 g/dL — ABNORMAL LOW (ref 3.5–5.0)
Alkaline Phosphatase: 90 U/L (ref 38–126)
Anion gap: 9 (ref 5–15)
BUN: <5 mg/dL — ABNORMAL LOW (ref 6–20)
CHLORIDE: 105 mmol/L (ref 101–111)
CO2: 22 mmol/L (ref 22–32)
CREATININE: 0.74 mg/dL (ref 0.44–1.00)
Calcium: 8.9 mg/dL (ref 8.9–10.3)
GFR calc non Af Amer: >60 mL/min (ref >60)
Glucose, Bld: 138 mg/dL — ABNORMAL HIGH (ref 65–99)
Potassium: 3.6 mmol/L (ref 3.5–5.1)
SODIUM: 136 mmol/L (ref 135–145)
Total Bilirubin: 0.7 mg/dL (ref 0.3–1.2)
Total Protein: 7 g/dL (ref 6.5–8.1)

## 2016-11-16 LAB — CBC
HCT: 33.1 % — ABNORMAL LOW (ref 36.0–46.0)
Hemoglobin: 10.4 g/dL — ABNORMAL LOW (ref 12.0–15.0)
MCH: 25.2 pg — AB (ref 26.0–34.0)
MCHC: 31.4 g/dL (ref 30.0–36.0)
MCV: 80.3 fL (ref 78.0–100.0)
PLATELETS: 328 10*3/uL (ref 150–400)
RBC: 4.12 MIL/uL (ref 3.87–5.11)
RDW: 13.9 % (ref 11.5–15.5)
WBC: 14.9 10*3/uL — ABNORMAL HIGH (ref 4.0–10.5)

## 2016-11-16 LAB — GLUCOSE, CAPILLARY
Glucose-Capillary: 92 mg/dL (ref 65–99)
Glucose-Capillary: 116 mg/dL — ABNORMAL HIGH (ref 65–99)

## 2016-11-16 LAB — HEPARIN LEVEL (UNFRACTIONATED): HEPARIN UNFRACTIONATED: 0.32 [IU]/mL (ref 0.30–0.70)

## 2016-11-16 LAB — APTT: aPTT: 48 seconds — ABNORMAL HIGH (ref 24–36)

## 2016-11-16 MED ORDER — RIVAROXABAN 20 MG PO TABS
20.0000 mg | ORAL_TABLET | Freq: Every day | ORAL | Status: DC
  Administered 2016-11-16: 20 mg via ORAL
  Filled 2016-11-16 (×2): qty 1

## 2016-11-16 MED ORDER — IBUPROFEN 800 MG PO TABS: 800.0000 mg | ORAL_TABLET | Freq: Three times a day (TID) | ORAL | Status: DC | PRN

## 2016-11-16 NOTE — Anesthesia Postprocedure Evaluation (Addendum)
Anesthesia Post Note  Patient: Carla Alexander  Procedure(s) Performed: Procedure(s) (LRB): LAPAROSCOPIC CHOLECYSTECTOMY (N/A)  Patient location during evaluation: PACU Anesthesia Type: General Level of consciousness: awake and sedated Pain management: pain level controlled Vital Signs Assessment: post-procedure vital signs reviewed and stable Respiratory status: spontaneous breathing Cardiovascular status: stable Postop Assessment: no signs of nausea or vomiting Anesthetic complications: no        Last Vitals:  Vitals:   11/15/16 2204 11/16/16 1400  BP: 119/80 118/75  Pulse: (!) 51 83  Resp: 20   Temp: 37.6 C 37.6 C    Last Pain:  Vitals:   11/16/16 1430  TempSrc:   PainSc: 7    Pain Goal: Patients Stated Pain Goal: 2 (11/16/16 1430)               Keiden Deskin JR,JOHN Mateo Flow

## 2016-11-16 NOTE — Progress Notes (Signed)
ANTICOAGULATION CONSULT NOTE  Pharmacy Consult:  Heparin  Indication:  History of DVT    No Known Allergies  Patient Measurements: Height: 5\' 8"  (172.7 cm) Weight: 242 lb 9.6 oz (110 kg) IBW/kg (Calculated) : 63.9 Heparin Dosing Weight: 89 kg  Vital Signs: Temp: 99.7 F (37.6 C) (05/15 2204) Temp Source: Oral (05/15 2204) BP: 119/80 (05/15 2204) Pulse Rate: 51 (05/15 2204)  Labs:  Recent Labs  11/13/16 0241  11/13/16 1325 11/14/16 0430  11/14/16 1807 11/15/16 0346 11/16/16 0028  HGB 12.8  --   --  11.8*  --   --  10.5*  --   HCT 38.7  --   --  35.5*  --   --  33.0*  --   PLT 348  --   --  303  --   --  300  --   APTT  --   < > 38* 74*  < > 48* 43* 48*  LABPROT  --   --  16.7* 18.8*  --   --   --   --   INR  --   --  1.35 1.56  --   --   --   --   HEPARINUNFRC  --   --  1.24*  --   --   --  0.31  --   CREATININE 0.74  --   --  0.96  --   --  0.76  --   < > = values in this interval not displayed.  Estimated Creatinine Clearance: 114.2 mL/min (by C-G formula based on SCr of 0.76 mg/dL).   Assessment: 8 YOF with history of DVT, Xarelto on hold, for heparin  Goal of Therapy:  Heparin level 0.3-0.7 units/ml aPTT 66-102 seconds Monitor platelets by anticoagulation protocol: Yes   Plan:  Increase Heparin 1650 units/hr Check heparin level in 8 hours.   Phillis Knack, PharmD, BCPS   11/16/2016, 2:16 AM

## 2016-11-16 NOTE — Progress Notes (Signed)
[facility] Progress Note  1 Day Post-Op  Subjective: CC: Soreness in RUQ Patient doing well with some soreness in RUQ, relieved with pain medication. Tolerating PO intake without n/v. No BM, passing flatus. UOP good.  VSS.   Objective: Vital signs in last 24 hours: Temp:  [98 F (36.7 C)-99.7 F (37.6 C)] 99.7 F (37.6 C) (05/15 2204) Pulse Rate:  [51-87] 51 (05/15 2204) Resp:  [16-21] 20 (05/15 2204) BP: (119-138)/(80-90) 119/80 (05/15 2204) SpO2:  [95 %-100 %] 98 % (05/15 2204) Last BM Date: 11/12/16  Intake/Output from previous day: 05/15 0701 - 05/16 0700 In: 3438 [P.O.:838; I.V.:2500; IV Piggyback:100] Out: 200 [Blood:200] Intake/Output this shift: Total I/O In: 516 [I.V.:466; IV Piggyback:50] Out: -   PE: Gen:  Alert, NAD, pleasant Pulm:  Normal effort, clear to auscultation bilaterally Abd: Soft, mildly TTP in RUQ, non-distended, bowel sounds present in all 4 quadrants, no HSM, incisions C/D/I Skin: warm and dry, no rashes  Psych: A&Ox3   Lab Results:   Recent Labs  11/14/16 0430 11/15/16 0346  WBC 15.5* 11.5*  HGB 11.8* 10.5*  HCT 35.5* 33.0*  PLT 303 300   BMET  Recent Labs  11/14/16 0430 11/15/16 0346  NA 134* 134*  K 3.5 3.1*  CL 102 104  CO2 23 23  GLUCOSE 138* 125*  BUN 6 <5*  CREATININE 0.96 0.76  CALCIUM 8.7* 8.8*   PT/INR  Recent Labs  11/13/16 1325 11/14/16 0430  LABPROT 16.7* 18.8*  INR 1.35 1.56   CMP     Component Value Date/Time   NA 134 (L) 11/15/2016 0346   K 3.1 (L) 11/15/2016 0346   CL 104 11/15/2016 0346   CO2 23 11/15/2016 0346   GLUCOSE 125 (H) 11/15/2016 0346   BUN <5 (L) 11/15/2016 0346   CREATININE 0.76 11/15/2016 0346   CREATININE 0.82 07/10/2014 1654   CALCIUM 8.8 (L) 11/15/2016 0346   PROT 6.3 (L) 11/15/2016 0346   ALBUMIN 2.9 (L) 11/15/2016 0346   AST 174 (H) 11/15/2016 0346   ALT 322 (H) 11/15/2016 0346   ALKPHOS 104 11/15/2016 0346   BILITOT 0.8 11/15/2016 0346   GFRNONAA >60  11/15/2016 0346   GFRNONAA 78 07/03/2013 0920   GFRAA >60 11/15/2016 0346   GFRAA 89 07/03/2013 0920   Lipase     Component Value Date/Time   LIPASE 39 11/13/2016 0241     Studies/Results: Dg Foot 2 Views Right  Result Date: 11/14/2016 CLINICAL DATA:  Pain over the base of third meta tarsal for 7 days EXAM: RIGHT FOOT - 2 VIEW COMPARISON:  None. FINDINGS: There is no evidence of fracture or dislocation. Moderate plantar calcaneal spur. IMPRESSION: No acute osseous abnormality Electronically Signed   By: Donavan Foil M.D.   On: 11/14/2016 16:41    Anti-infectives: Anti-infectives    Start     Dose/Rate Route Frequency Ordered Stop   11/13/16 1400  piperacillin-tazobactam (ZOSYN) IVPB 3.375 g  Status:  Discontinued     3.375 g 100 mL/hr over 30 Minutes Intravenous Every 8 hours 11/13/16 1233 11/13/16 1236   11/13/16 1400  piperacillin-tazobactam (ZOSYN) IVPB 3.375 g     3.375 g 12.5 mL/hr over 240 Minutes Intravenous Every 8 hours 11/13/16 1237         Assessment/Plan S/P laparoscopic cholecystectomy - POD#1 - patient is healing appropriately - CBC and CMET pending - stable for discharge home from a surgical standpoint - she will need to follow up in our office  in 2-3 weeks   LOS: 3 days    Brigid Re , [facility] 11/16/2016, 9:24 AM Pager: [phone] Consults: [phone] Mon-Fri 7:00 am-4:30 pm Sat-Sun 7:00 am-11:30 am

## 2016-11-16 NOTE — Progress Notes (Signed)
*  Preliminary Results* Bilateral lower extremity venous duplex completed. Bilateral lower extremities are negative for deep vein thrombosis. There is no evidence of Baker's cyst bilaterally.  11/16/2016 12:14 PM Maudry Mayhew, BS, RVT, RDCS, RDMS

## 2016-11-16 NOTE — Discharge Instructions (Addendum)
Carla Alexander, you had acute cholecystitis and had your gallbladder surgically removed. Your Xarelto was restarted after the surgery. You were sent home with pain medication. Please follow up with your PCP.   Information on my medicine - XARELTO (rivaroxaban)  This medication education was reviewed with me or my healthcare representative as part of my discharge preparation.  The pharmacist that spoke with me during my hospital stay was:  Jaquita Folds, [place]? Xarelto was prescribed for history of  blood clots that may have been found in the veins of your legs (deep vein thrombosis) or in your lungs (pulmonary embolism) and to reduce the risk of them occurring again.  What do you need to know about Xarelto? The dose is  one 20 mg tablet taken ONCE A DAY with your evening meal.  DO NOT stop taking Xarelto without talking to the health care provider who prescribed the medication.  Refill your prescription for 20 mg tablets before you run out.  After discharge, you should have regular check-up appointments with your healthcare provider that is prescribing your Xarelto.  In the future your dose may need to be changed if your kidney function changes by a significant amount.  What do you do if you miss a dose? Marland KitchenIf you are taking Xarelto ONCE DAILY and you miss a dose, take it as soon as you remember on the same day then continue your regularly scheduled once daily regimen the next day. Do not take two doses of Xarelto at the same time.   Important Safety Information Xarelto is a blood thinner medicine that can cause bleeding. You should call your healthcare provider right away if you experience any of the following: ? Bleeding from an injury or your nose that does not stop. ? Unusual colored urine (red or dark brown) or unusual colored stools (red or black). ? Unusual bruising for unknown reasons. ? A serious fall or if you hit your head (even if there is no  bleeding).  Some medicines may interact with Xarelto and might increase your risk of bleeding while on Xarelto. To help avoid this, consult your healthcare provider or pharmacist prior to using any new prescription or non-prescription medications, including herbals, vitamins, non-steroidal anti-inflammatory drugs (NSAIDs) and supplements.  This website has more information on Xarelto: [email or web address]  Your appointment is 12/13/16 at 9:30, please arrive at least 30 min before your appointment to complete your check in paperwork.  If you are unable to arrive 30 min prior to your appointment time we may have to cancel or reschedule you.  LAPAROSCOPIC SURGERY: POST OP INSTRUCTIONS  1. DIET: Follow a light bland diet the first 24 hours after arrival home, such as soup, liquids, crackers, etc. Be sure to include lots of fluids daily. Avoid fast food or heavy meals as your are more likely to get nauseated. Eat a low fat the next few days after surgery.  2. Take your usually prescribed home medications unless otherwise directed. 3. PAIN CONTROL:  1. Pain is best controlled by a usual combination of three different methods TOGETHER:  1. Ice/Heat 2. Over the counter pain medication 3. Prescription pain medication 2. Most patients will experience some swelling and bruising around the incisions. Ice packs or heating pads (30-60 minutes up to 6 times a day) will help. Use ice for the first few days to help decrease swelling and bruising, then switch to heat to help relax tight/sore spots and speed recovery. Some  people prefer to use ice alone, heat alone, alternating between ice & heat. Experiment to what works for you. Swelling and bruising can take several weeks to resolve.  3. It is helpful to take an over-the-counter pain medication regularly for the first few weeks. Choose one of the following that works best for you:  1. Naproxen (Aleve, etc) Two 220mg  tabs twice a day 2. Ibuprofen (Advil, etc) Three  200mg  tabs four times a day (every meal & bedtime) 3. Acetaminophen (Tylenol, etc) 500-650mg  four times a day (every meal & bedtime) 4. A prescription for pain medication (such as oxycodone, hydrocodone, etc) should be given to you upon discharge. Take your pain medication as prescribed.  1. If you are having problems/concerns with the prescription medicine (does not control pain, nausea, vomiting, rash, itching, etc), please call us [phone] to see if we need to switch you to a different pain medicine that will work better for you and/or control your side effect better. 2. If you need a refill on your pain medication, please contact your pharmacy. They will contact our office to request authorization. Prescriptions will not be filled after 5 pm or on week-ends. 4. Avoid getting constipated. Between the surgery and the pain medications, it is common to experience some constipation. Increasing fluid intake and taking a fiber supplement (such as Metamucil, Citrucel, FiberCon, MiraLax, etc) 1-2 times a day regularly will usually help prevent this problem from occurring. A mild laxative (prune juice, Milk of Magnesia, MiraLax, etc) should be taken according to package directions if there are no bowel movements after 48 hours.  5. Watch out for diarrhea. If you have many loose bowel movements, simplify your diet to bland foods & liquids for a few days. Stop any stool softeners and decrease your fiber supplement. Switching to mild anti-diarrheal medications (Kayopectate, Pepto Bismol) can help. If this worsens or does not improve, please call us. 6. Wash / shower every day. You may shower over the dressings as they are waterproof. Continue to shower over incision(s) after the dressing is off. 7. Remove your waterproof bandages 5 days after surgery. You may leave the incision open to air. You may replace a dressing/Band-Aid to cover the incision for comfort if you wish.  8. ACTIVITIES as tolerated:  1. You  may resume regular (light) daily activities beginning the next day--such as daily self-care, walking, climbing stairs--gradually increasing activities as tolerated. If you can walk 30 minutes without difficulty, it is safe to try more intense activity such as jogging, treadmill, bicycling, low-impact aerobics, swimming, etc. 2. Save the most intensive and strenuous activity for last such as sit-ups, heavy lifting, contact sports, etc Refrain from any heavy lifting or straining until you are off narcotics for pain control.  3. DO NOT PUSH THROUGH PAIN. Let pain be your guide: If it hurts to do something, don't do it. Pain is your body warning you to avoid that activity for another week until the pain goes down. 4. You may drive when you are no longer taking prescription pain medication, you can comfortably wear a seatbelt, and you can safely maneuver your car and apply brakes. 5. You may have sexual intercourse when it is comfortable.  9. FOLLOW UP in our office  1. Please call [facility] at [phone] to set up an appointment to see your surgeon in the office for a follow-up appointment approximately 2-3 weeks after your surgery. 2. Make sure that you call for this appointment the day you arrive  home to insure a convenient appointment time.      10. IF YOU HAVE DISABILITY OR FAMILY LEAVE FORMS, BRING THEM TO THE               OFFICE FOR PROCESSING.   WHEN TO CALL us [phone]:  1. Poor pain control 2. Reactions / problems with new medications (rash/itching, nausea, etc)  3. Fever over 101.5 F (38.5 C) 4. Inability to urinate 5. Nausea and/or vomiting 6. Worsening swelling or bruising 7. Continued bleeding from incision. 8. Increased pain, redness, or drainage from the incision  The clinic staff is available to answer your questions during regular business hours (8:30am-5pm). Please dont hesitate to call and ask to speak to one of our nurses for clinical concerns.  If you have a medical  emergency, go to the nearest emergency room or call 911.  A surgeon from [facility] is always on call at the [facility], [place], [place], [place], [place] [address] ?  MAIN: [phone] ? TOLL FREE: [phone] ?  FAX [phone]  [email or web address]

## 2016-11-16 NOTE — Progress Notes (Signed)
Orthopedic Tech Progress Note Patient Details:  Carla Alexander 19-Jan-1971 [id number]  Ortho Devices Type of Ortho Device: Postop shoe/boot Ortho Device/Splint Location: RLE Ortho Device/Splint Interventions: Ordered, Application   [name] 11/16/2016, 3:23 PM

## 2016-11-16 NOTE — Progress Notes (Signed)
Family Medicine Teaching Service Daily Progress Note Intern Pager: [phone]  Patient name: Carla Alexander Medical record number: [id number] Date of birth: April 05, 1971 Age: 46 y.o. Gender: female  Primary Care Provider: Steve Rattler, DO Consultants: GI and Surgery Code Status: FULL CODE  Pt Overview and Major Events to Date:  Carla Alexander is a 46yo woman with acute cholecystitis and choledocholithiasis who has a PMH significant for DVT (on Xarelto last dose 5/12), anemia, panic attacks and left breast mass. Started on Zosyn (5/12-) GI confirmed diagnosis with MRCP. On heparin drip currently and monitoring aPTT with goal of 66-102sec. Currently subtherapeutic at 48sec due to being off for 30 min overnight. Laparascopic cholecystectomy on 4/62 without complications. Right foot swelling with 3rd metatarsal pain on palpation.  Assessment and Plan: Carla Alexander is a 46yo woman with acute cholecystitis and choledocholithiasis (1.6cm stone in cystic duct) now s/p lap chole who has a PMH significant for DVT (on Xarelto last dose 5/12), anemia, panic attacks and left breast mass.  Acute cholecystitis and choledocholithiasis: status post laparoscopic cholecystectomy without complication.  Hepatitis panel negative. Stable for discharge from surgery perspective. 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 On heparin gtt with goal aPTT 66-102. Currently 48 sec. - Resume Xarelto today. Okay per surgery. - Follow up LE Doppler  Hypokalemia: 3.1 - repleted IV - f/u CMP  Right foot swelling:  tender to palpation over the head of the third metatarsal bone. DG showing no fracture. Symmetric calf circumference. Now with L foot mild swelling too. Able to walk, but with feeling of fullness between metatarsals.  -  Follow up LE Dopplers - Encourage foot elevation.  Prediabetes: A1c 5.9 - BID CBGs - Diabetes prevention education  Vit D deficiency. At home on daily cholecalciferol.  -Held on admission   FEN/GI:  -Resume diet  Prophylaxis:Xarelto  Disposition: transition to PO pain meds, get doppler then go home  Subjective:  Up in chair. Just got morphine dose and reporting good pain control. Plans to walk around floor again today. Motivated to use IS. Concerned about her feet swelling and asking about doppler study.   Objective: Temp:  [98 F (36.7 C)-99.7 F (37.6 C)] 99.7 F (37.6 C) (05/15 2204) Pulse Rate:  [51-87] 51 (05/15 2204) Resp:  [16-21] 20 (05/15 2204) BP: (119-138)/(80-90) 119/80 (05/15 2204) SpO2:  [95 %-100 %] 98 % (05/15 2204) Physical Exam: General: Well appearing woman, sitting in chair with daughter, Carla Alexander, at side Cardiovascular: RRR no murmurs Respiratory: CTAB no crackles or wheezes Abdomen: Non distended, appropriately tender. Three trocar sites incisions clean dry and intact Extremities: Right foot with 1+ pitting edema and tender to palpation over 3rd metatarsal. Left foot with mild non pitting edema. Negative homan's sign.   Laboratory:  Recent Labs Lab 11/13/16 0241 11/14/16 0430 11/15/16 0346  WBC 10.4 15.5* 11.5*  HGB 12.8 11.8* 10.5*  HCT 38.7 35.5* 33.0*  PLT 348 303 300    Recent Labs Lab 11/13/16 0241 11/14/16 0430 11/15/16 0346  NA 134* 134* 134*  K 3.6 3.5 3.1*  CL 104 102 104  CO2 21* 23 23  BUN 5* 6 <5*  CREATININE 0.74 0.96 0.76  CALCIUM 9.3 8.7* 8.8*  PROT 7.6 6.7 6.3*  BILITOT 0.8 1.6* 0.8  ALKPHOS 65 89  104  ALT 251* 349* 322*  AST 241* 212* 174*  GLUCOSE 152* 138* 125*   Imaging/Diagnostic Tests: Doppler pending.   Karl Pock, Medical Student 11/16/2016, 9:11 AM MS4, [place] Intern pager: [phone], text pages  welcome  ________________________________________________________________________ I agree with the medical student's note above, with notable exceptions in the assessment and plan which I have given below.   Carla Alexander is a 46 y.o.,, here with acute cholecystitis and choledocholithiasis (1.6cm stone in cystic duct) now s/p lap chole who has a PMH significant for DVT (on Xarelto last dose 5/12), anemia, panic attacks and left breast mass.  Physical Exam Gen: Well appearing woman  Pulm: CTAB, no wheezes, rhonchi  CV: RRR, no murmurs  Abd: BS+, appropriately tender, 3 surgical lesions, well healing   Plan:   Acute cholecystitis and choledocholithiasis: status post laparoscopic cholecystectomy without complication.  Hepatitis panel negative. Stable for discharge from surgery perspective. - Zosyn (5/14-16) Stop ABX today, no need to continue per surgery - Transition from IV morphine to PO oxy with script for 7 days total - Incentive spirometer and education given  History of DVT. LE Doppler without signs of DVT. On Xarelto for the last 4 years. On heparin gtt with goal aPTT 66-102. Currently 48 sec. - Resume Xarelto today. Okay per surgery.  Right foot swelling:  tender to palpation over the head of the third metatarsal bone. DG showing no fracture. Symmetric calf circumference. Now with L foot mild swelling too. Able to walk, but with feeling of fullness between metatarsals.  - Follow up LE Dopplers - Encourage foot elevation.  Prediabetes: A1c 5.9 - BID CBGs - Diabetes prevention education  Vit D deficiency. At home on daily cholecalciferol.  -Held on admission   FEN/GI:  -Resume diet  Prophylaxis:Xarelto  Disposition: transition to PO pain meds, get doppler then go home  Kerrin Mo, MD Family Medicine, PGY 2 11/16/2016 3:43 PM

## 2016-11-16 NOTE — Care Management Note (Signed)
Case Management Note  Patient Details  Name: Carla Alexander MRN: [id number] Date of Birth: Jun 28, 1971  Subjective/Objective:                    Action/Plan:  PMH significant for DVT (on Xarelto last dose 5/12),  Expected Discharge Date:                  Expected Discharge Plan:  Home/Self Care  In-House Referral:     Discharge planning Services     Post Acute Care Choice:    Choice offered to:     DME Arranged:    DME Agency:     HH Arranged:    [place] Agency:     Status of Service:  In process, will continue to follow  If discussed at Long Length of Stay Meetings, dates discussed:    Additional Comments:  Marilu Favre, RN 11/16/2016, 10:20 AM

## 2016-11-17 LAB — GLUCOSE, CAPILLARY
GLUCOSE-CAPILLARY: 88 mg/dL (ref 65–99)
GLUCOSE-CAPILLARY: 105 mg/dL — AB (ref 65–99)

## 2016-11-17 MED ORDER — ONDANSETRON HCL 4 MG PO TABS: 4.0000 mg | ORAL_TABLET | Freq: Four times a day (QID) | ORAL | 0 refills | Status: DC | PRN

## 2016-11-17 MED ORDER — OXYCODONE HCL 5 MG PO TABS: 5.0000 mg | ORAL_TABLET | ORAL | 0 refills | Status: DC | PRN

## 2016-11-17 MED ORDER — POLYETHYLENE GLYCOL 3350 17 G PO PACK: 17.0000 g | PACK | Freq: Every day | ORAL | 0 refills | Status: DC

## 2016-11-17 MED ORDER — BISACODYL 5 MG PO TBEC
10.0000 mg | DELAYED_RELEASE_TABLET | Freq: Once | ORAL | Status: AC
  Administered 2016-11-17: 10 mg via ORAL
  Filled 2016-11-17: qty 2

## 2016-11-17 MED ORDER — IBUPROFEN 800 MG PO TABS: 800.0000 mg | ORAL_TABLET | Freq: Three times a day (TID) | ORAL | 0 refills | Status: DC | PRN

## 2016-11-17 NOTE — Progress Notes (Signed)
Transitions of Care Pharmacy Note  Plan:  Educated on oxycodone for pain, continued xarelto from PTA, and potential need for stool softeners while taking narcotics Addressed concerns regarding nausea, as well as drug interactions with her PTA phentermine  Recommend holding phentermine until oxycodone prescription is completed (may enhance opioid effects). Also consider having patient touch base with provider who manages her phentermine to ensure she is okay to continue holding.  Inpatient follow-up: patient requesting something for nausea  --------------------------------------------- Carla Alexander is an 46 y.o. female who presents with a chief complaint of abdominal pain. In anticipation of discharge, pharmacy has reviewed this patient's prior to admission medication history, as well as current inpatient medications listed per the [facility].  Current medication indications, dosing, frequency, and notable side effects reviewed with patient and family. patient and family verbalized understanding of current inpatient medication regimen and is aware that the After Visit Summary when presented, will represent the most accurate medication list at discharge.   RIVEN BEEBE expressed concerns regarding what to take for nausea at home. Informed patient I would relay this information to the primary team. Patient also concerned about resuming her phentermine when she returns home. I also told the patient I would discuss this with the primary team - and that she should follow the instructions on her D/C AVS regarding when to resume this medication.    We discussed the side effects commonly associated with narcotic use, to include nausea, constipation, and drowsiness. I encouraged her not to drive or take other sedating medications while taking oxycodone. We also reviewed her PTA xarelto, which has been resumed. She is aware taking NSAIDs would increase her risk for bleeding - and I offered Tylenol as a potential  OTC option for pain, when and if needed.   Assessment: Understanding of regimen: excellent Understanding of indications: excellent Potential of compliance: excellent Barriers to Obtaining Medications: No  Patient instructed to contact inpatient pharmacy team with further questions or concerns if needed.    Time spent preparing for discharge counseling: 10 min  Time spent counseling patient: 15 min    Argie Ramming, PharmD Pharmacy Resident  Pager [phone] 11/17/16 9:26 AM

## 2016-11-17 NOTE — Progress Notes (Signed)
Family Medicine Teaching Service Daily Progress Note Intern Pager: [phone]  Patient name: Carla Alexander Medical record number: [id number] Date of birth: 11/12/1970 Age: 46 y.o. Gender: female  Primary Care Provider: Steve Rattler, DO Consultants: GI, Surgery Code Status: FULL CODE  Pt Overview and Major Events to Date:  Acute cholecystitis and choledocholithiasis s/p lap chole. Zosyn 5/14-16. Right foot swelling, negative doppler and xray. Home Xarelto restarted. Home today  Assessment and Plan: Carla Alexander is a 46yo woman with acute cholecystitis and choledocholithiasis (1.6cm stone in cystic duct) now s/p lap chole who has a PMH significant for DVT (on Xarelto last dose 5/12), anemia, panic attacks and left breast mass.  Acute cholecystitis and choledocholithiasis: status post laparoscopic cholecystectomy without complication. Hepatitis panel negative. Stable for discharge from surgery perspective. Zosyn (5/14-16)  - Well controlled on PO oxy with script for 7 days total - D/c with anti emetic and colase - Incentive spirometer and education given  History of DVT. On Xarelto for the last 4 years. On Xarelto.  - Home today on home Xarelto  Hypokalemia resolved: 3.6 today  Right foot swelling: tender to palpation over the head of the third metatarsal bone. DG showing no fracture. Symmetric calf circumference. Now with L foot mild swelling too. Able to walk, but with feeling of fullness between metatarsals. Doppler negative. - Encourage foot elevation.  Prediabetes:A1c 5.9 - BID CBGs - Diabetes prevention education  Vit D deficiency. At home on daily cholecalciferol.  -Held on admission   Weightloss:  On Fenteramine at home for weightloss. Not continued during admission.  - Advised to hold this med while on oxy due to prolongation of oxy effects when combined  FEN/GI:  -Resume diet  Prophylaxis:Xarelto  Disposition: Discharge  Subjective:  Resting  comfortably. Ready to go home.   Objective: Temp:  [99.2 F (37.3 C)-99.7 F (37.6 C)] 99.2 F (37.3 C) (05/17 0558) Pulse Rate:  [76-83] 76 (05/17 0558) Resp:  [18] 18 (05/17 0558) BP: (109-118)/(62-75) 109/62 (05/17 0558) SpO2:  [97 %-100 %] 98 % (05/17 0558) Physical Exam: General: Well appearing woman, lying in bed Cardiovascular: RRR no murmurs Respiratory: CTAB no crackles or wheezes Abdomen: Non distended, appropriately tender. Three trocar sites incisions clean dry and intact Extremities: Right foot with 1+ pitting edema and tender to palpation over 3rd metatarsal. Left foot with mild non pitting edema. Negative homan's sign.   Laboratory:  Recent Labs Lab 11/14/16 0430 11/15/16 0346 11/16/16 0949  WBC 15.5* 11.5* 14.9*  HGB 11.8* 10.5* 10.4*  HCT 35.5* 33.0* 33.1*  PLT 303 300 328    Recent Labs Lab 11/14/16 0430 11/15/16 0346 11/16/16 0949  NA 134* 134* 136  K 3.5 3.1* 3.6  CL 102 104 105  CO2 23 23 22   BUN 6 <5* <5*  CREATININE 0.96 0.76 0.74  CALCIUM 8.7* 8.8* 8.9  PROT 6.7 6.3* 7.0  BILITOT 1.6* 0.8 0.7  ALKPHOS 89 104 90  ALT 349* 322* 238*  AST 212* 174* 73*  GLUCOSE 138* 125* 138*   Imaging/Diagnostic Tests:  VAS [place] LE DOPPLER bilateral Summary:  - No evidence of deep vein thrombosis involving the right lower   extremity and left lower extremity. - No evidence of Baker&'s cyst on the right or left.  Karl Pock, Medical Student 11/17/2016, 8:28 AM MS4, [place] Intern pager: [phone], text pages welcome  I have seen and evaluated the patient with Student Dr. Karrie Meres. I am in agreement with  the note. See the discharge summary for more.   Wendee Beavers, MD, PGY-2 11/18/2016 12:42 AM

## 2016-11-17 NOTE — Progress Notes (Signed)
2 Days Post-Op   Subjective/Chief Complaint: Having incisional pain at the umbilicus but otherwise ok   Objective: Vital signs in last 24 hours: Temp:  [99.2 F (37.3 C)-99.7 F (37.6 C)] 99.2 F (37.3 C) (05/17 0558) Pulse Rate:  [76-83] 76 (05/17 0558) Resp:  [18] 18 (05/17 0558) BP: (109-118)/(62-75) 109/62 (05/17 0558) SpO2:  [97 %-100 %] 98 % (05/17 0558) Last BM Date: 11/12/16  Intake/Output from previous day: 05/16 0701 - 05/17 0700 In: 1354 [P.O.:838; I.V.:466; IV Piggyback:50] Out: -  Intake/Output this shift: No intake/output data recorded.  Exam: Abdomen soft, incisions clean without any erythema  Lab Results:   Recent Labs  11/15/16 0346 11/16/16 0949  WBC 11.5* 14.9*  HGB 10.5* 10.4*  HCT 33.0* 33.1*  PLT 300 328   BMET  Recent Labs  11/15/16 0346 11/16/16 0949  NA 134* 136  K 3.1* 3.6  CL 104 105  CO2 23 22  GLUCOSE 125* 138*  BUN <5* <5*  CREATININE 0.76 0.74  CALCIUM 8.8* 8.9   PT/INR No results for input(s): LABPROT, INR in the last 72 hours. ABG No results for input(s): PHART, HCO3 in the last 72 hours.  Invalid input(s): PCO2, PO2  Studies/Results: No results found.  Anti-infectives: Anti-infectives    Start     Dose/Rate Route Frequency Ordered Stop   11/13/16 1400  piperacillin-tazobactam (ZOSYN) IVPB 3.375 g  Status:  Discontinued     3.375 g 100 mL/hr over 30 Minutes Intravenous Every 8 hours 11/13/16 1233 11/13/16 1236   11/13/16 1400  piperacillin-tazobactam (ZOSYN) IVPB 3.375 g  Status:  Discontinued     3.375 g 12.5 mL/hr over 240 Minutes Intravenous Every 8 hours 11/13/16 1237 11/16/16 1454      Assessment/Plan: s/p Procedure(s): LAPAROSCOPIC CHOLECYSTECTOMY (N/Alexander)  Stable from Alexander surgical standpoint for discharge. Will f/u with [facility] post op  LOS: 4 days    Carla Alexander 11/17/2016

## 2016-11-17 NOTE — Discharge Summary (Signed)
[facility] Discharge Summary  Patient name: Carla Alexander Medical record number: [id number] Date of birth: February 25, 1971 Age: 46 y.o. Gender: female Date of Admission: 11/13/2016  Date of Discharge: 11/17/16  Admitting Physician: Dickie La, MD  Primary Care Provider: Steve Rattler, DO Consultants: GI, Surgery  Indication for Hospitalization: Acute cholecystitis  Discharge Diagnoses/Problem List:  1. Acute cholecystitis and choledocholithiasis (1.6cm in cystic duct) s/p lap chole 2. History of DVT on Xarelto 3. Prediabetes A1c 5.9 4. Vit D Deficiency on cholecalciferol  Disposition: Being discharged to home.   Discharge Condition: Healthy and stable.  Discharge Exam:  General: Well appearing woman, lying in bed Cardiovascular: RRR no murmurs Respiratory: CTAB no crackles or wheezes Abdomen: Non distended, appropriately tender. Three trocar sites incisions clean dry and intact Extremities: Right foot with 1+ pitting edema and tender to palpation over 3rd metatarsal. Left foot with mild non pitting edema. Negative homan's sign.   Brief Hospital Course:  Carla Alexander is a 46yo woman with history of DVT, now on Xarelto, who presented to the ED with abdominal pain, nausea, vomiting and decreased PO intake for 3 days. She was diagnosed with acute cholecystitis by bedside RUQ ultrasound and 1.6cm gallstone was visualized in the cystic duct on MRCP by GI. She was started on Zosyn on 5/14. Surgery was consulted and had to wait 48 hours for Xarelto washouts. She was put on heparin gtt for her DVT while awiating surgery.  Laparoscopic cholecystectomy which was done on 11/15/2016. Gallbladder was found to be inflamed and distended, stuck to omentum. Heparin was started 24 hours after surgery then stopped and Xarelto restarted. Zosyn was stopped on 5/16. Pain was managed with IV morphine which was transitioned to PO oxycodone and tylenol 24 hours after surgery. Ms  Sibilia recuperated well.  During her hospitalization she mentioned right foot pain and her foot was noted to be swollen to the ankle. She was tender to palpation on the third metatarsal, but xray was normal. Due to history of DVTs lower extremity doppler was done and that was negative as well. Patient was sent home with 3 days of PO oxycodone for two days, antiemetic, stool softener and right foot boot and directions to follow up with PCP for follow up xray in case new fracture was not yet visible on plain film.   Issues for Follow Up:  1. Cholecystitis status post laparoscopic cholecystectomy.  2. Right foot film if still painful to rule out occult fracture  Significant Procedures: Laparoscopic cholecystectomy  Significant Labs and Imaging:   Recent Labs Lab 11/14/16 0430 11/15/16 0346 11/16/16 0949  WBC 15.5* 11.5* 14.9*  HGB 11.8* 10.5* 10.4*  HCT 35.5* 33.0* 33.1*  PLT 303 300 328    Recent Labs Lab 11/13/16 0241 11/14/16 0430 11/15/16 0346 11/16/16 0949  NA 134* 134* 134* 136  K 3.6 3.5 3.1* 3.6  CL 104 102 104 105  CO2 21* 23 23 22   GLUCOSE 152* 138* 125* 138*  BUN 5* 6 <5* <5*  CREATININE 0.74 0.96 0.76 0.74  CALCIUM 9.3 8.7* 8.8* 8.9  ALKPHOS 65 89 104 90  AST 241* 212* 174* 73*  ALT 251* 349* 322* 238*  ALBUMIN 3.9 3.2* 2.9* 3.0*    Results/Tests Pending at Time of Discharge: None  Discharge Medications:  Allergies as of 11/17/2016   No Known Allergies     Medication List    STOP taking these medications   oxymetazoline 0.05 % nasal spray Commonly  known as:  AFRIN NASAL SPRAY   phentermine 37.5 MG tablet Commonly known as:  ADIPEX-P   traMADol 50 MG tablet Commonly known as:  ULTRAM     TAKE these medications   acetaminophen 500 MG tablet Commonly known as:  TYLENOL Take 1,000 mg by mouth every 6 (six) hours as needed for fever (pain).   Cholecalciferol 1000 units capsule Commonly known as:  [employer] VITAMIN D3 Take 1 capsule (1,000 Units  total) by mouth daily.   ondansetron 4 MG tablet Commonly known as:  ZOFRAN Take 1 tablet (4 mg total) by mouth every 6 (six) hours as needed for nausea.   oxyCODONE 5 MG immediate release tablet Commonly known as:  Oxy IR/ROXICODONE Take 1-2 tablets (5-10 mg total) by mouth every 4 (four) hours as needed for moderate pain.   polyethylene glycol packet Commonly known as:  MIRALAX / GLYCOLAX Take 17 g by mouth daily.   XARELTO 20 MG Tabs tablet Generic drug:  rivaroxaban TAKE 1 TABLET (20 MG TOTAL) BY MOUTH DAILY WITH SUPPER.       Discharge Instructions: Please refer to Patient Instructions section of EMR for full details.  Patient was counseled important signs and symptoms that should prompt return to medical care, changes in medications, dietary instructions, activity restrictions, and follow up appointments.   Follow-Up Appointments: Follow-up Information    Steve Rattler, DO. Go on 11/18/2016.   Why:  10:30am (hospital f/u) Contact information: [place] [address] [phone]        Surgery, [place]. Go on 12/13/2016.   Specialty:  General Surgery Why:  at 9 :30 AM for post-operative follow up. Arrive 30 min prior to appointment time for check-in. Bring photo ID and insurance information with you to your appointment.  Contact information: [place] [address] [phone]           Mercy Riding, MD 11/17/2016, 3:01 PM MS4, [place]

## 2016-11-18 ENCOUNTER — Encounter: Payer: Self-pay | Admitting: Family Medicine

## 2016-11-18 ENCOUNTER — Ambulatory Visit (INDEPENDENT_AMBULATORY_CARE_PROVIDER_SITE_OTHER): Payer: BC Managed Care – PPO | Admitting: Family Medicine

## 2016-11-18 VITALS — BP 110/78 | HR 82 | Temp 98.1°F | Ht 68.0 in | Wt 252.0 lb

## 2016-11-18 DIAGNOSIS — M79671 Pain in right foot: Secondary | ICD-10-CM

## 2016-11-18 DIAGNOSIS — K9189 Other postprocedural complications and disorders of digestive system: Secondary | ICD-10-CM

## 2016-11-18 DIAGNOSIS — K567 Ileus, unspecified: Secondary | ICD-10-CM

## 2016-11-18 LAB — TYPE AND SCREEN
ABO/RH(D): B POS
ANTIBODY SCREEN: POSITIVE
DAT, IGG: NEGATIVE
Donor AG Type: NEGATIVE
Donor AG Type: NEGATIVE
PT AG TYPE: NEGATIVE
UNIT DIVISION: 0
Unit division: 0

## 2016-11-18 LAB — BPAM RBC
BLOOD PRODUCT EXPIRATION DATE: 201806122359
Blood Product Expiration Date: 201806122359
UNIT TYPE AND RH: 7300
UNIT TYPE AND RH: 7300

## 2016-11-18 MED ORDER — SENNA 8.6 MG PO TABS: 1.0000 | ORAL_TABLET | Freq: Every day | ORAL | 2 refills | Status: AC

## 2016-11-18 NOTE — Patient Instructions (Addendum)
Please take Miralax daily, you can do this up to 2 times a day as needed to have a bowel movement. Please use Senna at night, this will help your bowels constrict to move stool along.  If your abdomen becomes bloated, more painful, and you cannot pass gas please go to the ER.   You can get your foot x-ray any time on Monday, no appointment is needed.   If you have questions or concerns please do not hesitate to call at [phone].   Constipation, Adult Constipation is when a person:  Poops (has a bowel movement) fewer times in a week than normal.  Has a hard time pooping.  Has poop that is dry, hard, or bigger than normal. Follow these instructions at home: Eating and drinking    Eat foods that have a lot of fiber, such as:  Fresh fruits and vegetables.  Whole grains.  Beans.  Eat less of foods that are high in fat, low in fiber, or overly processed, such as:  [place] fries.  Hamburgers.  Cookies.  Candy.  Soda.  Drink enough fluid to keep your pee (urine) clear or pale yellow. General instructions   Exercise regularly or as told by your doctor.  Go to the restroom when you feel like you need to poop. Do not hold it in.  Take over-the-counter and prescription medicines only as told by your doctor. These include any fiber supplements.  Do pelvic floor retraining exercises, such as:  Doing deep breathing while relaxing your lower belly (abdomen).  Relaxing your pelvic floor while pooping.  Watch your condition for any changes.  Keep all follow-up visits as told by your doctor. This is important. Contact a doctor if:  You have pain that gets worse.  You have a fever.  You have not pooped for 4 days.  You throw up (vomit).  You are not hungry.  You lose weight.  You are bleeding from the anus.  You have thin, pencil-like poop (stool). Get help right away if:  You have a fever, and your symptoms suddenly get worse.  You leak poop or have  blood in your poop.  Your belly feels hard or bigger than normal (is bloated).  You have very bad belly pain.  You feel dizzy or you faint. This information is not intended to replace advice given to you by your health care provider. Make sure you discuss any questions you have with your health care provider. Document Released: 12/07/2007 Document Revised: 01/08/2016 Document Reviewed: 12/09/2015 Elsevier Interactive Patient Education  2017 [employer].

## 2016-11-18 NOTE — Progress Notes (Signed)
    Subjective:    Patient ID: Carla Alexander, female    DOB: 08-22-1970, 46 y.o.   MRN: [id number]   CC: here for hosp follow up   Cholecystectomy Doing ok post-operatively, has not had BM in 6 days. Is passing gas. Denies abdominal pain. RUQ pain has subsided after surgery. She was taking colace and miralax in hospital. Has not used either since discharge home (d/c yesterday 5pm). She is taking oxycodone for pain and received in this hospital. She felt urge to have BM before but no results. She is eating normally, has some nausea.   Foot pain Foot still hurts and is swollen. Denies injury or trauma to area. X-ray in hospital negative for fracture. She can walk on it but it is tender to bear weight. She has been resting.   Smoking status reviewed- non-smoker  Review of Systems- see HPI   Objective:  BP 110/78   Pulse 82   Temp 98.1 F (36.7 C) (Oral)   Ht 5\' 8"  (1.727 m)   Wt 252 lb (114.3 kg)   LMP 11/01/2008   SpO2 97%   BMI 38.32 kg/m  Vitals and nursing note reviewed  General: well nourished, in no acute distress Cardiac: RRR, clear S1 and S2, no murmurs, rubs, or gallops Respiratory: clear to auscultation bilaterally, no increased work of breathing Abdomen: soft, nontender, nondistended, +BS in all 4 quadrants. Umbilical incision healing without erythema or discharge  Extremities: right foot mildly edematous compared to right. TTP over 2nd and 3rd metatarsals.  Skin: warm and dry, no rashes noted Neuro: alert and oriented, no focal deficits   Assessment & Plan:    Right foot pain  Acute, unclear etiology. Negative x-ray while in hospital, pain persistent  -recommended elevation, ice, rest -repeat x-ray of foot in 2-3 days at patient's convenience  -follow up if worsens  Ileus, postoperative (HCC)  S/p lap chole on 5/15, passing gas but no BM since, has chronic constipation and also using opiates for pain relief post-surgery  -advised using Miralax 1-2  times daily  -added senna every night -ED precautions for abdominal distention, pain, or failure to pass gas     Return in about 3 months (around 02/18/2017).   Lucila Maine, DO Family Medicine Resident PGY-1

## 2016-11-18 NOTE — Assessment & Plan Note (Signed)
  S/p lap chole on 5/15, passing gas but no BM since, has chronic constipation and also using opiates for pain relief post-surgery  -advised using Miralax 1-2 times daily  -added senna every night -ED precautions for abdominal distention, pain, or failure to pass gas

## 2016-11-18 NOTE — Assessment & Plan Note (Signed)
  Acute, unclear etiology. Negative x-ray while in hospital, pain persistent  -recommended elevation, ice, rest -repeat x-ray of foot in 2-3 days at patient's convenience  -follow up if worsens

## 2016-11-21 ENCOUNTER — Ambulatory Visit (HOSPITAL_COMMUNITY)
Admission: RE | Admit: 2016-11-21 | Discharge: 2016-11-21 | Disposition: A | Discharge status: Home / Self Care | Payer: BC Managed Care – PPO | Source: Ambulatory Visit | Attending: Family Medicine | Admitting: Family Medicine

## 2016-11-21 ENCOUNTER — Telehealth: Payer: Self-pay | Admitting: Family Medicine

## 2016-11-21 DIAGNOSIS — M79671 Pain in right foot: Secondary | ICD-10-CM | POA: Insufficient documentation

## 2016-11-21 NOTE — Telephone Encounter (Signed)
Pt would like someone to call her with x-ray results. ep

## 2016-11-22 NOTE — Telephone Encounter (Signed)
I am covering Dr. Stefano Gaul inbox. Please call patient and let her know that her XR is normal. She can continue elevation, ice, and rest. Thanks!

## 2016-11-22 NOTE — Telephone Encounter (Signed)
Spoke with patient and she doesn't want to just get more pain medication and would prefer to wait until Dr. Vanetta Shawl can think about possibly more imaging.  Will forward to MD to make her aware of this. Asha Grumbine,CMA

## 2016-11-22 NOTE — Telephone Encounter (Signed)
Patient is aware of results and said that she is still having the pain.  Patient is currently only taking tylenol since she is on blood thinners and would like to know if she can have an MRI.  Explained to patient that provider is not in the office this week and that her covering provider would probably leave this decision up to her PCP.  Patient voiced understanding and will wait for our call on what the next step is.  Graylyn Bunney,CMA

## 2016-11-22 NOTE — Telephone Encounter (Signed)
Thanks so much, Carla Alexander. If she is concerned this week while Dr. Vanetta Shawl is gone, she could be seen by someone else as well.

## 2016-11-29 NOTE — Telephone Encounter (Signed)
  Called Carla Alexander to discuss foot pain, she reports it's greatly improved and the swelling has improved. She does not want any other imaging done as it has healed.  She is concerned about some blood she noticed in her stool yesterday. It was a small amount of bright red blood. She denies straining to have a bowel movement. She is having pain in her tailbone as well. She is having some pain also in her RUQ.   Advised she come in to be seen to discuss rectal pain/bleeding.   Advised she call surgery regarding RUQ pain.   Patient agreed with plan. Appointment scheduled for tomorrow at 2pm with Dr. Allayne Gitelman, DO PGY-1, [facility] 11/29/2016 3:19 PM

## 2016-11-30 ENCOUNTER — Encounter: Payer: Self-pay | Admitting: Family Medicine

## 2016-11-30 ENCOUNTER — Ambulatory Visit (INDEPENDENT_AMBULATORY_CARE_PROVIDER_SITE_OTHER): Payer: BC Managed Care – PPO | Admitting: Family Medicine

## 2016-11-30 VITALS — BP 110/62 | HR 68 | Wt 239.6 lb

## 2016-11-30 DIAGNOSIS — S300XXA Contusion of lower back and pelvis, initial encounter: Secondary | ICD-10-CM | POA: Diagnosis not present

## 2016-11-30 DIAGNOSIS — K625 Hemorrhage of anus and rectum: Secondary | ICD-10-CM | POA: Diagnosis not present

## 2016-11-30 MED ORDER — POLYETHYLENE GLYCOL 3350 17 G PO PACK: 17.0000 g | PACK | Freq: Every day | ORAL | 0 refills | Status: AC

## 2016-11-30 NOTE — Patient Instructions (Addendum)
For your rectal bleeding, - We recommend a colonoscopy for a better evaluation. Someone will call you in the next 2 weeks to schedule - Take miralax daily to keep your bowel movements regular - I recommend supportive care for your tailbone. Tylenol as needed and sit on a soft pillow.  Take care and seek immediate care sooner if you develop any concerns.   Dr. Bufford Lope, [place]

## 2016-11-30 NOTE — Progress Notes (Signed)
    Subjective:  Carla Alexander is a 46 y.o. female who presents to the [facility] today with a chief complaint of rectal bleeding.   HPI:  Noticed bright red blood "spots" in her stool a couple of times, last was on Monday but none since. Was constipated at the time from being on pain medication post surgery. Also having pain in her tailbone that is dull and achy, worse with sitting that she noticed after her surgery. No lightheadedness/dizziness, falls, n/v/d, melena.   ROS: Per HPI  Objective:  Physical Exam: BP 110/62   Pulse 68   Wt 239 lb 9.6 oz (108.7 kg)   LMP 11/01/2008   SpO2 99%   BMI 36.43 kg/m   Gen: NAD, resting comfortably CV: RRR with no murmurs appreciated GI: Normal bowel sounds present. Soft, Nontender, Nondistended.  Rectum inspected with anoscope, internal sphincter visualized. No hemorrhoids appreciated.   MSK: no edema, cyanosis, or clubbing noted. Coccyx TTP without erythema or lesions. Skin: warm, dry Neuro: grossly normal, moves all extremities   Assessment/Plan:  Rectal bleeding Uncertain etiology, patient may still have internal hemorrhoids that were not able to be appreciated on anoscopy. Given multiple episodes of BRB, will need diagnostic colonoscopy. Also recommended miralax to keep BMs regular and soft.  Coccygeal contusion Tailbone pain is likely a mild contusion. Recommended supportive care with soft cushion and tylenol prn for discomfort.   Bufford Lope, DO PGY-1, [facility] 11/30/2016 2:46 PM

## 2016-12-01 DIAGNOSIS — K625 Hemorrhage of anus and rectum: Secondary | ICD-10-CM | POA: Insufficient documentation

## 2016-12-01 DIAGNOSIS — S300XXA Contusion of lower back and pelvis, initial encounter: Secondary | ICD-10-CM | POA: Insufficient documentation

## 2016-12-01 NOTE — Assessment & Plan Note (Signed)
Uncertain etiology, patient may still have internal hemorrhoids that were not able to be appreciated on anoscopy. Given multiple episodes of BRB, will need diagnostic colonoscopy. Also recommended miralax to keep BMs regular and soft.

## 2016-12-01 NOTE — Assessment & Plan Note (Signed)
Tailbone pain is likely a mild contusion. Recommended supportive care with soft cushion and tylenol prn for discomfort.

## 2016-12-09 NOTE — Addendum Note (Signed)
Addendum  created 12/09/16 1238 by Lyn Hollingshead, MD   Sign clinical note

## 2017-03-27 ENCOUNTER — Encounter: Payer: Self-pay | Admitting: Family Medicine

## 2017-03-27 ENCOUNTER — Ambulatory Visit (INDEPENDENT_AMBULATORY_CARE_PROVIDER_SITE_OTHER): Payer: BC Managed Care – PPO | Admitting: Family Medicine

## 2017-03-27 VITALS — BP 120/74 | HR 77 | Temp 98.4°F | Wt 223.0 lb

## 2017-03-27 DIAGNOSIS — R234 Changes in skin texture: Secondary | ICD-10-CM | POA: Diagnosis not present

## 2017-03-27 MED ORDER — TRIAMCINOLONE ACETONIDE 0.1 % EX OINT: 1.0000 "application " | TOPICAL_OINTMENT | Freq: Two times a day (BID) | CUTANEOUS | 0 refills | Status: AC

## 2017-03-27 NOTE — Patient Instructions (Addendum)
Please make follow up appointment in 2 weeks for recheck.  If you have questions or concerns please do not hesitate to call at [phone].  Lucila Maine, DO PGY-2, [facility] 03/27/2017 4:02 PM   Eczema Eczema, also called atopic dermatitis, is a skin disorder that causes inflammation of the skin. It causes a red rash and dry, scaly skin. The skin becomes very itchy. Eczema is generally worse during the cooler winter months and often improves with the warmth of summer. Eczema usually starts showing signs in infancy. Some children outgrow eczema, but it may last through adulthood. What are the causes? The exact cause of eczema is not known, but it appears to run in families. People with eczema often have a family history of eczema, allergies, asthma, or hay fever. Eczema is not contagious. Flare-ups of the condition may be caused by:  Contact with something you are sensitive or allergic to.  Stress.  What are the signs or symptoms?  Dry, scaly skin.  Red, itchy rash.  Itchiness. This may occur before the skin rash and may be very intense. How is this diagnosed? The diagnosis of eczema is usually made based on symptoms and medical history. How is this treated? Eczema cannot be cured, but symptoms usually can be controlled with treatment and other strategies. A treatment plan might include:  Controlling the itching and scratching. ? Use over-the-counter antihistamines as directed for itching. This is especially useful at night when the itching tends to be worse. ? Use over-the-counter steroid creams as directed for itching. ? Avoid scratching. Scratching makes the rash and itching worse. It may also result in a skin infection (impetigo) due to a break in the skin caused by scratching.  Keeping the skin well moisturized with creams every day. This will seal in moisture and help prevent dryness. Lotions that contain alcohol and water should be avoided because they  can dry the skin.  Limiting exposure to things that you are sensitive or allergic to (allergens).  Recognizing situations that cause stress.  Developing a plan to manage stress.  Follow these instructions at home:  Only take over-the-counter or prescription medicines as directed by your health care provider.  Do not use anything on the skin without checking with your health care provider.  Keep baths or showers short (5 minutes) in warm (not hot) water. Use mild cleansers for bathing. These should be unscented. You may add nonperfumed bath oil to the bath water. It is best to avoid soap and bubble bath.  Immediately after a bath or shower, when the skin is still damp, apply a moisturizing ointment to the entire body. This ointment should be a petroleum ointment. This will seal in moisture and help prevent dryness. The thicker the ointment, the better. These should be unscented.  Keep fingernails cut short. Children with eczema may need to wear soft gloves or mittens at night after applying an ointment.  Dress in clothes made of cotton or cotton blends. Dress lightly, because heat increases itching.  A child with eczema should stay away from anyone with fever blisters or cold sores. The virus that causes fever blisters (herpes simplex) can cause a serious skin infection in children with eczema. Contact a health care provider if:  Your itching interferes with sleep.  Your rash gets worse or is not better within 1 week after starting treatment.  You see pus or soft yellow scabs in the rash area.  You have a fever.  You have a  rash flare-up after contact with someone who has fever blisters. This information is not intended to replace advice given to you by your health care provider. Make sure you discuss any questions you have with your health care provider. Document Released: 06/17/2000 Document Revised: 11/26/2015 Document Reviewed: 01/21/2013 Elsevier Interactive Patient Education   2017 [employer].

## 2017-03-27 NOTE — Progress Notes (Signed)
    Subjective:    Patient ID: Carla Alexander, female    DOB: [birth year], 46 y.o.   MRN: [id number]   CC: rash on right breast   Has noticed an itchy dry patch on her right breast. Worse with hot showers and after scratching it the patch hurts and burns. UTD on mammograms, last in February- diagnostic mammo was done to examine right breast further and was found to have no evidence of malignancy in right breast. She denies feeling new lump in breast or under arm. She denies history of eczema. Does endorse some skin changes under pannus that itch occasionally as well. Has been swimming a lot lately in effort to lose weight. Has not tried any topical creams/ointments on this patch.   Smoking status reviewed- non-smoker  Review of Systems- no unintentional weight loss, fatigue, other rashes   Objective:  BP 120/74   Pulse 77   Temp 98.4 F (36.9 C) (Oral)   Wt 223 lb (101.2 kg)   LMP 11/01/2008   SpO2 99%   BMI 33.91 kg/m  Vitals and nursing note reviewed  General: well nourished, in no acute distress Cardiac: RRR, clear S1 and S2, no murmurs, rubs, or gallops Respiratory: clear to auscultation bilaterally, no increased work of breathing Abdomen: obese abdomen, soft, non-tender Extremities: no edema or cyanosis. Skin: warm and dry. Rough, scaly patch on right breast at 3'oclock. No erythema. Chronic skin changes noted under pannus with hyperpigmentation.  Neuro: alert and oriented, no focal deficits  Assessment & Plan:    Change of skin of breast  Right breast with scaly patch consistent in appearance to eczema. Does not appear to be fungal. Not consistent in appearance with peau d'orange.   -will treat with topical triamcinolone for 2 weeks then reassess for improvement -if not improved may try fungal cream or biopsy -Patient verbalized understanding and agreement with plan    Return in about 2 weeks (around 04/10/2017).   Lucila Maine, DO Family Medicine Resident  PGY-2

## 2017-03-28 ENCOUNTER — Encounter: Payer: Self-pay | Admitting: Family Medicine

## 2017-03-28 DIAGNOSIS — R234 Changes in skin texture: Secondary | ICD-10-CM | POA: Insufficient documentation

## 2017-03-28 NOTE — Assessment & Plan Note (Signed)
  Right breast with scaly patch consistent in appearance to eczema. Does not appear to be fungal. Not consistent in appearance with peau d'orange.   -will treat with topical triamcinolone for 2 weeks then reassess for improvement -if not improved may try fungal cream or biopsy -Patient verbalized understanding and agreement with plan

## 2017-04-10 ENCOUNTER — Ambulatory Visit: Payer: BC Managed Care – PPO | Admitting: Family Medicine

## 2017-06-14 ENCOUNTER — Other Ambulatory Visit: Payer: Self-pay | Admitting: Family Medicine

## 2017-07-06 ENCOUNTER — Other Ambulatory Visit: Payer: Self-pay | Admitting: Family Medicine

## 2017-07-06 DIAGNOSIS — Z1231 Encounter for screening mammogram for malignant neoplasm of breast: Secondary | ICD-10-CM

## 2017-07-13 ENCOUNTER — Ambulatory Visit (INDEPENDENT_AMBULATORY_CARE_PROVIDER_SITE_OTHER): Payer: BC Managed Care – PPO | Admitting: Family Medicine

## 2017-07-13 ENCOUNTER — Encounter: Payer: Self-pay | Admitting: Family Medicine

## 2017-07-13 ENCOUNTER — Other Ambulatory Visit: Payer: Self-pay

## 2017-07-13 VITALS — BP 110/74 | HR 74 | Temp 98.2°F | Ht 68.0 in | Wt 214.0 lb

## 2017-07-13 DIAGNOSIS — R739 Hyperglycemia, unspecified: Secondary | ICD-10-CM

## 2017-07-13 DIAGNOSIS — D649 Anemia, unspecified: Secondary | ICD-10-CM | POA: Diagnosis not present

## 2017-07-13 DIAGNOSIS — R6889 Other general symptoms and signs: Secondary | ICD-10-CM | POA: Diagnosis not present

## 2017-07-13 DIAGNOSIS — E559 Vitamin D deficiency, unspecified: Secondary | ICD-10-CM

## 2017-07-13 LAB — POCT GLYCOSYLATED HEMOGLOBIN (HGB A1C): Hemoglobin A1C: 5.8

## 2017-07-13 NOTE — Progress Notes (Signed)
    Subjective:    Patient ID: Carla Alexander, female    DOB: 04/21/1971, 47 y.o.   MRN: [id number]   CC: here for PE  Thinks she is anemic Grandfather had colon cancer in his later years    HPI:   Smoking status reviewed  Review of Systems   Objective:  BP 110/74   Pulse 74   Temp 98.2 F (36.8 C) (Oral)   Ht 5\' 8"  (1.727 m)   Wt 214 lb (97.1 kg)   LMP 11/01/2008   SpO2 100%   BMI 32.54 kg/m  Vitals and nursing note reviewed  General: well nourished, in no acute distress HEENT: normocephalic, TM's visualized bilaterally, no scleral icterus or conjunctival pallor, no nasal discharge, moist mucous membranes, good dentition without erythema or discharge noted in posterior oropharynx Neck: supple, non-tender, without lymphadenopathy Cardiac: RRR, clear S1 and S2, no murmurs, rubs, or gallops Respiratory: clear to auscultation bilaterally, no increased work of breathing Abdomen: soft, nontender, nondistended, no masses or organomegaly. Bowel sounds present Extremities: no edema or cyanosis. Warm, well perfused. 2+ radial and PT pulses bilaterally Skin: warm and dry, no rashes noted Neuro: alert and oriented, no focal deficits   Assessment & Plan:    No problem-specific Assessment & Plan notes found for this encounter.    No Follow-up on file.   Lucila Maine, DO Family Medicine Resident PGY-2

## 2017-07-13 NOTE — Patient Instructions (Signed)
   It was great seeing you today!  Keep up the great work with your weight loss and exercise.  I'll check your blood counts, thyroid, vitamin D level, and sugar levels today and let you know the results likely on Monday.  If you have questions or concerns please do not hesitate to call at [phone].  Lucila Maine, DO PGY-2, [facility] 07/13/2017 3:37 PM

## 2017-07-13 NOTE — Progress Notes (Signed)
    Subjective:    Patient ID: Carla Alexander, female    DOB: 06-07-1971, 47 y.o.   MRN: [id number]   CC: physical exam, concern for anemia  Doing well overall.Thinks she is anemic due to cold hands, she has a hx of anemia. Not taking iron consistently as it causes stomach upset/constipation. States she has "always been anemic". Has had hysterectomy in past for heavy menstrual bleeding. Denies bleeding elsewhere. Denies fatigue, palpitations. Has hx of vit d deficiency and takes vitamin d supplementation rarely as well due to forgetting.   Working on weight loss, exercising on treadmill or walking 4 x a week with some success. She is going to a weight loss clinic on [address].  No alcohol or tobacco use. No drug use. She has a strong FH of diabetes in her parents and all her sisters.   Smoking status reviewed- non-smoker  Review of Systems- no CP, SOB, DOE.   Objective:  BP 110/74   Pulse 74   Temp 98.2 F (36.8 C) (Oral)   Ht 5\' 8"  (1.727 m)   Wt 214 lb (97.1 kg)   LMP 11/01/2008   SpO2 100%   BMI 32.54 kg/m  Vitals and nursing note reviewed  General: well nourished, in no acute distress HEENT: normocephalic, conjunctival pallor present bilaterally,  no nasal discharge, moist mucous membranes, good dentition without erythema or discharge noted in posterior oropharynx Neck: supple, non-tender, without lymphadenopathy Cardiac: RRR, clear S1 and S2, no murmurs, rubs, or gallops Respiratory: clear to auscultation bilaterally, no increased work of breathing Abdomen: soft, nontender, nondistended, no masses or organomegaly. Bowel sounds present Extremities: no edema or cyanosis. Skin: warm and dry, no rashes noted Neuro: alert and oriented, no focal deficits  Assessment & Plan:    Anemia  History of iron deficiency anemia. Will check CBC today.  Vitamin D deficiency  Last vit D level 24, patient taking supplementation intermittently  -check level today  Cold  intolerance of hand  Patient attributes this to anemia however hx of abnormal TSH, will check TSH today to r/o thyroid issue  Hyperglycemia  Strong FH of diabetes and elevated glucose on prior labs -check A1C today    Return in about 6 months (around 01/10/2018), or if symptoms worsen or fail to improve.   Carla Maine, DO Family Medicine Resident PGY-2

## 2017-07-14 DIAGNOSIS — R739 Hyperglycemia, unspecified: Secondary | ICD-10-CM | POA: Insufficient documentation

## 2017-07-14 DIAGNOSIS — R6889 Other general symptoms and signs: Secondary | ICD-10-CM | POA: Insufficient documentation

## 2017-07-14 LAB — CBC
HEMATOCRIT: 35.4 % (ref 34.0–46.6)
Hemoglobin: 11.6 g/dL (ref 11.1–15.9)
MCH: 25.8 pg — ABNORMAL LOW (ref 26.6–33.0)
MCHC: 32.8 g/dL (ref 31.5–35.7)
MCV: 79 fL (ref 79–97)
PLATELETS: 353 10*3/uL (ref 150–379)
RBC: 4.49 x10E6/uL (ref 3.77–5.28)
RDW: 14.4 % (ref 12.3–15.4)
WBC: 6.5 10*3/uL (ref 3.4–10.8)

## 2017-07-14 LAB — TSH: TSH: 1.05 u[IU]/mL (ref 0.450–4.500)

## 2017-07-14 LAB — VITAMIN D 25 HYDROXY (VIT D DEFICIENCY, FRACTURES): VIT D 25 HYDROXY: 16 ng/mL — AB (ref 30.0–100.0)

## 2017-07-14 NOTE — Assessment & Plan Note (Signed)
  Patient attributes this to anemia however hx of abnormal TSH, will check TSH today to r/o thyroid issue

## 2017-07-14 NOTE — Assessment & Plan Note (Signed)
  Last vit D level 24, patient taking supplementation intermittently  -check level today

## 2017-07-14 NOTE — Assessment & Plan Note (Signed)
  History of iron deficiency anemia. Will check CBC today.

## 2017-07-14 NOTE — Assessment & Plan Note (Signed)
  Strong FH of diabetes and elevated glucose on prior labs -check A1C today

## 2017-07-17 ENCOUNTER — Telehealth: Payer: Self-pay | Admitting: Family Medicine

## 2017-07-17 NOTE — Telephone Encounter (Addendum)
  Entered in error. Letter sent with results.

## 2017-07-19 ENCOUNTER — Encounter: Payer: Self-pay | Admitting: Family Medicine

## 2017-07-24 ENCOUNTER — Encounter: Payer: Self-pay | Admitting: Family Medicine

## 2017-08-02 ENCOUNTER — Ambulatory Visit
Admission: RE | Admit: 2017-08-02 | Discharge: 2017-08-02 | Disposition: A | Discharge status: Home / Self Care | Payer: BC Managed Care – PPO | Source: Ambulatory Visit | Attending: Family Medicine | Admitting: Family Medicine

## 2017-08-02 DIAGNOSIS — Z1231 Encounter for screening mammogram for malignant neoplasm of breast: Secondary | ICD-10-CM

## 2017-08-09 ENCOUNTER — Other Ambulatory Visit: Payer: Self-pay

## 2017-08-09 ENCOUNTER — Ambulatory Visit (INDEPENDENT_AMBULATORY_CARE_PROVIDER_SITE_OTHER): Payer: BC Managed Care – PPO | Admitting: Family Medicine

## 2017-08-09 ENCOUNTER — Encounter: Payer: Self-pay | Admitting: Family Medicine

## 2017-08-09 DIAGNOSIS — L723 Sebaceous cyst: Secondary | ICD-10-CM | POA: Diagnosis not present

## 2017-08-09 DIAGNOSIS — L089 Local infection of the skin and subcutaneous tissue, unspecified: Secondary | ICD-10-CM | POA: Diagnosis not present

## 2017-08-09 MED ORDER — DOXYCYCLINE HYCLATE 100 MG PO TABS: 100.0000 mg | ORAL_TABLET | Freq: Two times a day (BID) | ORAL | 0 refills | Status: AC

## 2017-08-09 NOTE — Patient Instructions (Signed)
I sent in an antibiotic for your infected sebaceous cyst.   The cyst will likely remain once the infection is gone.   The only way to get rid of the cyst completely is to cut it out.  We would want to do that after the infection was cleared.

## 2017-08-09 NOTE — Assessment & Plan Note (Signed)
Draining on own.  Add doxy.  Educated that cyst removal would be elective surgery when not infected.

## 2017-08-09 NOTE — Progress Notes (Signed)
Date of Visit: 08/09/2017   HPI: 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 She says that she had felt a "knot" on her leg for several years that has been infected in the past. She has noticed redness and a foul smelling purulent discharge that has been self draining. She denies fevers or signs of systemic illness.   ROS: See HPI.  PHYSICAL EXAM: BP 110/66   Pulse 74   Temp 98.4 F (36.9 C) (Oral)   Ht 5\' 8"  (1.727 m)   Wt 213 lb 6.4 oz (96.8 kg)   LMP 11/01/2008   SpO2 99%   BMI 32.45 kg/m  Gen: Well appearing female in NAD HEENT: Normocephalic.  Heart: RRR.  Lungs: No increased work of breathing.  Neuro: No focal deficits.  Ext: L lateral proximal thigh with small mobile draining mass surrounded by erythema and warmth.   ASSESSMENT/PLAN: #Infected Sebaceous Cyst - Given history of recurrence and exam, likely a sebaceous cyst that has become infected. - Prescribed 10 day course of PO doxycycline 100mg  BID. - Advised patient that this may recur and that the only permanent solution would be excision. - Gave return precautions if symptoms worsen.  Health maintenance:  -Refused flu shot  Dot Lanes, MS3

## 2017-08-22 ENCOUNTER — Other Ambulatory Visit: Payer: Self-pay

## 2017-08-22 ENCOUNTER — Ambulatory Visit (INDEPENDENT_AMBULATORY_CARE_PROVIDER_SITE_OTHER): Payer: BC Managed Care – PPO | Admitting: Family Medicine

## 2017-08-22 ENCOUNTER — Encounter: Payer: Self-pay | Admitting: Family Medicine

## 2017-08-22 VITALS — BP 110/70 | HR 72 | Temp 98.6°F | Ht 68.0 in | Wt 215.0 lb

## 2017-08-22 DIAGNOSIS — L089 Local infection of the skin and subcutaneous tissue, unspecified: Secondary | ICD-10-CM | POA: Diagnosis not present

## 2017-08-22 DIAGNOSIS — Z86718 Personal history of other venous thrombosis and embolism: Secondary | ICD-10-CM

## 2017-08-22 DIAGNOSIS — L723 Sebaceous cyst: Secondary | ICD-10-CM | POA: Diagnosis not present

## 2017-08-22 NOTE — Patient Instructions (Addendum)
It was a pleasure to see you today! Thank you for choosing [facility] for your primary care. Carla Alexander was seen for infected sebaceous cyst. Come back to the clinic if you have any new concerns, and go to the emergency room if you get any life threatening symptoms.  Today, we have arranged an appt with the surgeon at 2:30pm to potentially get the infection drained/excised.   Please let us know if you need anything.`     If we did any lab work today, and the results require attention, either me or my nurse will get in touch with you. If everything is normal, you will get a letter in mail and a message via . If you don't hear from [place] in two weeks, please give [place] a call. Otherwise, we look forward to seeing you again at your next visit. If you have any questions or concerns before then, please call the clinic at [phone].  Please bring all your medications to every doctors visit  Sign up for My Chart to have easy access to your labs results, and communication with your Primary care physician.    Please check-out at the front desk before leaving the clinic.    Best,  Dr. Sherene Sires FAMILY MEDICINE RESIDENT - PGY1 08/22/2017 10:55 AM

## 2017-08-22 NOTE — Progress Notes (Signed)
    Subjective:  Carla Alexander is a 47 y.o. female who presents to the [facility] today with a chief complaint of infected sebaceous cyst in left anteiror thigh.   HPI: This has been an issue that has occurred before.  Since prior experience (years ago) she has had a small pea sized firm/painless nudule in this location.  App.  14 days ago this started growing in size and became red/painful.   Two days into the infection she came to the clinic and was given a 10day course of doxy which she says she took as prescribed.  Her impression is the infection resolved slightly but not completely toward the end of the course but in the last two days since the meds ran out she thinks it has gotten worse again.   She has no distal deficits with the expception of shooting pain down the leg if someone squeezes the wound.   She has no has a fever/nausea/vomiting/lesions elsewhere or any other symptoms.  She has been using a warm compress and expressing the abscess whenever it opens enough to drain.  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 MSK: no edema, cyanosis, or clubbing noted Skin: left anterior thigh with firm lesion app. 2" diameter, raised, with surroundign warmth/erythema app. 1".  Appearance is consistent with abscess vs infected sebaceous cyst.  Otherwise warm, dry with no other lesions noted Neuro: grossly normal, moves all extremities Psych: Normal affect and thought content  No results found for this or any previous visit (from the past 72  hour(s)).   Assessment/Plan:  Infected sebaceous cyst Still present 2 days after 2 day course of doxy, given potential of needing to excise the cyst and not having derm clinic today referal is being made to "same day" gen surg for evaluation.  They have been contacted and notified about xarelto and patient's presentation and plan is for her to be seen today.  History of DVT (deep vein thrombosis) No compartment firmness/distal perfusion/sensation deficits.  Patient takes xarelto chronically as prescribed and physical exam does not indicate likely involvement of DVT.   Gen surg. Notified about xarelto use as there is likely I/D forthcoming   Sherene Sires, [place] - PGY1 08/23/2017 7:53 AM\

## 2017-08-23 ENCOUNTER — Ambulatory Visit (HOSPITAL_COMMUNITY)
Admission: RE | Admit: 2017-08-23 | Discharge: 2017-08-23 | Disposition: A | Discharge status: Home / Self Care | Payer: BC Managed Care – PPO | Source: Ambulatory Visit | Attending: Student | Admitting: Student

## 2017-08-23 ENCOUNTER — Other Ambulatory Visit: Payer: Self-pay | Admitting: Student

## 2017-08-23 DIAGNOSIS — Z86718 Personal history of other venous thrombosis and embolism: Secondary | ICD-10-CM | POA: Diagnosis present

## 2017-08-23 DIAGNOSIS — M25562 Pain in left knee: Secondary | ICD-10-CM

## 2017-08-23 NOTE — Assessment & Plan Note (Addendum)
Still present 2 days after 2 day course of doxy, given potential of needing to excise the cyst and not having derm clinic today referal is being made to "same day" gen surg for evaluation.  They have been contacted and notified about xarelto and patient's presentation and plan is for her to be seen today.

## 2017-08-23 NOTE — Assessment & Plan Note (Signed)
No compartment firmness/distal perfusion/sensation deficits.  Patient takes xarelto chronically as prescribed and physical exam does not indicate likely involvement of DVT.   Gen surg. Notified about xarelto use as there is likely I/D forthcoming

## 2017-08-23 NOTE — Progress Notes (Signed)
*  Preliminary Results* Left lower extremity venous duplex completed. Left lower extremity is negative for deep vein thrombosis. There is no evidence of left Baker's cyst.  08/23/2017 3:00 PM  Maudry Mayhew, BS, RVT, RDCS, RDMS

## 2017-10-15 ENCOUNTER — Encounter: Payer: Self-pay | Admitting: Family Medicine

## 2017-10-27 NOTE — Telephone Encounter (Signed)
Pt called and said that her nurses at the surgeons office are telling her they dont think she should continue to take her blood thinner before her surgery on Tuesday. Pt told me the surgery is more in depth than just removing the cyst. Pt would like Dr Vanetta Shawl to call her to discuss what is the correct thing to do when it comes to taking this medication. Pt needs contacted ASAP!

## 2017-10-27 NOTE — Telephone Encounter (Signed)
  Spoke with [name]. She is having cyst removed as outpatient procedure on Tuesday. She will be under general anesthesia. She was told by surgeon's office to stop blood thinner.  She had unprovoked DVT in 2012. She had a second one in 2013. No PE.  She is on xarelto since then.   Advised she is at low to moderate risk for recurrence of DVT if stopping xarelto. She is also at low to moderate risk for bleeding with surgery if stopping xarelto.   Her preference is to stop xarelto prior to surgery. I advised her to restart xarelto the following day.   Lucila Maine, DO PGY-2, [facility] 10/27/2017 4:58 PM

## 2017-10-31 ENCOUNTER — Other Ambulatory Visit: Payer: Self-pay | Admitting: General Surgery

## 2018-03-01 ENCOUNTER — Ambulatory Visit: Payer: BC Managed Care – PPO | Admitting: Family Medicine

## 2018-04-02 ENCOUNTER — Emergency Department (HOSPITAL_COMMUNITY)
Admission: EM | Admit: 2018-04-02 | Discharge: 2018-04-02 | Disposition: A | Discharge status: Home / Self Care | Payer: BC Managed Care – PPO | Attending: Emergency Medicine | Admitting: Emergency Medicine

## 2018-04-02 ENCOUNTER — Emergency Department (HOSPITAL_COMMUNITY): Payer: BC Managed Care – PPO

## 2018-04-02 ENCOUNTER — Encounter (HOSPITAL_COMMUNITY): Payer: Self-pay | Admitting: Emergency Medicine

## 2018-04-02 ENCOUNTER — Other Ambulatory Visit: Payer: Self-pay

## 2018-04-02 DIAGNOSIS — Z7901 Long term (current) use of anticoagulants: Secondary | ICD-10-CM | POA: Diagnosis not present

## 2018-04-02 DIAGNOSIS — Y9301 Activity, walking, marching and hiking: Secondary | ICD-10-CM | POA: Insufficient documentation

## 2018-04-02 DIAGNOSIS — W108XXA Fall (on) (from) other stairs and steps, initial encounter: Secondary | ICD-10-CM | POA: Diagnosis not present

## 2018-04-02 DIAGNOSIS — S99911A Unspecified injury of right ankle, initial encounter: Secondary | ICD-10-CM | POA: Diagnosis present

## 2018-04-02 DIAGNOSIS — Y929 Unspecified place or not applicable: Secondary | ICD-10-CM | POA: Insufficient documentation

## 2018-04-02 DIAGNOSIS — Y999 Unspecified external cause status: Secondary | ICD-10-CM | POA: Insufficient documentation

## 2018-04-02 DIAGNOSIS — S93491A Sprain of other ligament of right ankle, initial encounter: Secondary | ICD-10-CM | POA: Insufficient documentation

## 2018-04-02 MED ORDER — IBUPROFEN 800 MG PO TABS
800.0000 mg | ORAL_TABLET | Freq: Once | ORAL | Status: AC
  Administered 2018-04-02: 800 mg via ORAL
  Filled 2018-04-02: qty 1

## 2018-04-02 MED ORDER — OXYCODONE HCL 5 MG PO TABS
5.0000 mg | ORAL_TABLET | Freq: Once | ORAL | Status: AC
  Administered 2018-04-02: 5 mg via ORAL
  Filled 2018-04-02: qty 1

## 2018-04-02 MED ORDER — ACETAMINOPHEN 500 MG PO TABS
1000.0000 mg | ORAL_TABLET | Freq: Once | ORAL | Status: AC
  Administered 2018-04-02: 1000 mg via ORAL
  Filled 2018-04-02: qty 2

## 2018-04-02 NOTE — ED Provider Notes (Signed)
[facility] EMERGENCY DEPARTMENT Provider Note   CSN: [id number] Arrival date & time: 04/02/18  6160     History   Chief Complaint Chief Complaint  Patient presents with  . Foot Pain    HPI SHALLEN LUEDKE is a 47 y.o. female.  47 yo F with a chief complaint of right ankle pain.  The patient fell going down the last couple steps this morning.  Complaining of pain to the ankle worse on the outside.  Denies other injury.  Denies knee pain.  She felt that she could not move her toes at some point.  Worse with bearing weight but is able to bear weight.  The history is provided by the patient.  Injury  This is a new problem. The current episode started 1 to 2 hours ago. The problem occurs constantly. The problem has not changed since onset.Pertinent negatives include no chest pain, no headaches and no shortness of breath. The symptoms are aggravated by walking. Nothing relieves the symptoms. She has tried nothing for the symptoms. The treatment provided no relief.    Past Medical History:  Diagnosis Date  . Anxiety   . Cholecystitis 11/2016  . DVT (deep venous thrombosis) ([place])   . Fibroid     Patient Active Problem List   Diagnosis Date Noted  . Infected sebaceous cyst 08/09/2017  . Cold intolerance of hand 07/14/2017  . Hyperglycemia 07/14/2017  . Change of skin of breast 03/28/2017  . History of DVT (deep vein thrombosis) 11/13/2016  . Arm pain, musculoskeletal, left 07/11/2016  . Breast mass, left 08/07/2014  . Abnormal mammogram 07/28/2014  . Vitamin D deficiency 12/11/2013  . Abnormal TSH 12/11/2013  . Breast pain in female 07/26/2013  . Health care maintenance 07/03/2013  . Panic attacks 09/13/2011  . Anemia 10/01/2010  . Headache(784.0) 08/13/2010  . Chronic anticoagulation 08/13/2010  . DVT (deep venous thrombosis) ([place]) 07/09/2010  . 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 BREAST BIOPSY Left      benign  . CHOLECYSTECTOMY N/A 11/15/2016   Procedure: LAPAROSCOPIC CHOLECYSTECTOMY;  Surgeon: Coralie Keens, MD;  Location: [facility];  Service: General;  Laterality: N/A;  . TUBAL LIGATION    . VAGINAL HYSTERECTOMY  2010   LAV[facility]     OB History    Gravida  2   Para  2   Term  2   Preterm      AB      Living  2     SAB      TAB      Ectopic      Multiple      Live Births               Home Medications    Prior to Admission medications   Medication Sig Start Date End Date Taking? Authorizing Provider  acetaminophen (TYLENOL) 500 MG tablet Take 1,000 mg by mouth every 6 (six) hours as needed for fever (pain).    [provider]  Cholecalciferol ([employer] VITAMIN D3) 1000 units capsule Take 1 capsule (1,000 Units total) by mouth daily. 10/24/16   Steve Rattler, DO  doxycycline (VIBRA-TABS) 100 MG tablet Take 1 tablet (100 mg total) by mouth 2 (two) times daily. 08/09/17   Zenia Resides, MD  polyethylene glycol (MIRALAX / GLYCOLAX) packet Take 17 g by mouth daily. 11/30/16   Bufford Lope, DO  senna (SENOKOT) 8.6 MG TABS  tablet Take 1 tablet (8.6 mg total) by mouth at bedtime. 11/18/16   Steve Rattler, DO  triamcinolone ointment (KENALOG) 0.1 % Apply 1 application topically 2 (two) times daily. 03/27/17   Steve Rattler, DO  XARELTO 20 MG TABS tablet TAKE 1 TABLET (20 MG TOTAL) BY MOUTH DAILY WITH SUPPER. 06/15/17   Lucila Maine C, DO  warfarin (COUMADIN) 10 MG tablet Take 1 to 1.5 tabs po daily as prescribed in clinic 12/31/10 09/08/11  Alveda Reasons, MD    Family History Family History  Problem Relation Age of Onset  . Heart disease Mother   . Diabetes Mother   . Stroke Father   . Diabetes Sister   . Heart disease Sister   . Heart disease Maternal Grandfather   . Diabetes Maternal Grandfather   . Cancer Maternal Grandfather        Colon  . Diabetes Maternal Grandmother   . Cancer Maternal Grandmother        Unknown type    Social  History Social History   Tobacco Use  . Smoking status: Never Smoker  . Smokeless tobacco: Never Used  Substance Use Topics  . Alcohol use: No  . Drug use: No     Allergies   Patient has no known allergies.   Review of Systems Review of Systems  Constitutional: Negative for chills and fever.  HENT: Negative for congestion and rhinorrhea.   Eyes: Negative for redness and visual disturbance.  Respiratory: Negative for shortness of breath and wheezing.   Cardiovascular: Negative for chest pain and palpitations.  Gastrointestinal: Negative for nausea and vomiting.  Genitourinary: Negative for dysuria and urgency.  Musculoskeletal: Positive for arthralgias. Negative for myalgias.  Skin: Negative for pallor and wound.  Neurological: Negative for dizziness and headaches.     Physical Exam Updated Vital Signs BP (!) 131/100 (BP Location: Right Arm)   Pulse 66   Temp 98.3 F (36.8 C) (Oral)   Resp 18   LMP 11/01/2008   SpO2 100%   Physical Exam  Constitutional: She is oriented to person, place, and time. She appears well-developed and well-nourished. No distress.  HENT:  Head: Normocephalic and atraumatic.  Eyes: Pupils are equal, round, and reactive to light. EOM are normal.  Neck: Normal range of motion. Neck supple.  Cardiovascular: Normal rate and regular rhythm. Exam reveals no gallop and no friction rub.  No murmur heard. Pulmonary/Chest: Effort normal. She has no wheezes. She has no rales.  Abdominal: Soft. She exhibits no distension. There is no tenderness.  Musculoskeletal: She exhibits edema and tenderness.  Tenderness and edema to the right ankle worse at the right malleolus.  No pain at the base of the fifth metatarsal or the navicular bone.  Pulse motor and sensation is intact distally.  No fibular neck tenderness.  Neurological: She is alert and oriented to person, place, and time.  Skin: Skin is warm and dry. She is not diaphoretic.  Psychiatric: She has a  normal mood and affect. Her behavior is normal.  Nursing note and vitals reviewed.    ED Treatments / Results  Labs (all labs ordered are listed, but only abnormal results are displayed) Labs Reviewed - No data to display  EKG None  Radiology Dg Ankle 2 Views Right  Result Date: 04/02/2018 CLINICAL DATA:  Ankle injury, swelling EXAM: RIGHT ANKLE - 2 VIEW COMPARISON:  None. FINDINGS: Lateral soft tissue swelling. Joint effusion noted. No fracture, subluxation or dislocation. Plantar calcaneal spur  present. IMPRESSION: Lateral soft tissue swelling with joint effusion within the right ankle. No acute bony abnormality. Electronically Signed   By: Rolm Baptise M.D.   On: 04/02/2018 08:51    Procedures Procedures (including critical care time)  Medications Ordered in ED Medications  acetaminophen (TYLENOL) tablet 1,000 mg (has no administration in time range)  ibuprofen (ADVIL,MOTRIN) tablet 800 mg (has no administration in time range)  oxyCODONE (Oxy IR/ROXICODONE) immediate release tablet 5 mg (has no administration in time range)     Initial Impression / Assessment and Plan / ED Course  I have reviewed the triage vital signs and the nursing notes.  Pertinent labs & imaging results that were available during my care of the patient were reviewed by me and considered in my medical decision making (see chart for details).     47 yo F with a chief complaint of right ankle pain.  This was after a inversion injury.  Pain along the lateral malleolus.  Plain films viewed by me without fracture.  Will place in a ASO crutches.  8:55 AM:  I have discussed the diagnosis/risks/treatment options with the patient and family and believe the pt to be eligible for discharge home to follow-up with PCP. We also discussed returning to the ED immediately if new or worsening sx occur. We discussed the sx which are most concerning (e.g., sudden worsening pain, numbness, tingling) that necessitate  immediate return. Medications administered to the patient during their visit and any new prescriptions provided to the patient are listed below.  Medications given during this visit Medications  acetaminophen (TYLENOL) tablet 1,000 mg (has no administration in time range)  ibuprofen (ADVIL,MOTRIN) tablet 800 mg (has no administration in time range)  oxyCODONE (Oxy IR/ROXICODONE) immediate release tablet 5 mg (has no administration in time range)      The patient appears reasonably screen and/or stabilized for discharge and I doubt any other medical condition or other [facility] requiring further screening, evaluation, or treatment in the ED at this time prior to discharge.    Final Clinical Impressions(s) / ED Diagnoses   Final diagnoses:  Sprain of anterior talofibular ligament of right ankle, initial encounter    ED Discharge Orders    None       Deno Etienne, DO 04/02/18 [phone]

## 2018-04-02 NOTE — Discharge Instructions (Signed)
Take 4 over the counter ibuprofen tablets 3 times a day or 2 over-the-counter naproxen tablets twice a day for pain. Also take tylenol 1000mg(2 extra strength) four times a day.    

## 2018-06-13 ENCOUNTER — Other Ambulatory Visit: Payer: Self-pay | Admitting: Family Medicine

## 2018-06-18 ENCOUNTER — Encounter: Payer: Self-pay | Admitting: Family Medicine

## 2018-06-19 ENCOUNTER — Other Ambulatory Visit: Payer: Self-pay | Admitting: Family Medicine

## 2018-06-19 DIAGNOSIS — Z1231 Encounter for screening mammogram for malignant neoplasm of breast: Secondary | ICD-10-CM

## 2018-08-03 ENCOUNTER — Ambulatory Visit
Admission: RE | Admit: 2018-08-03 | Discharge: 2018-08-03 | Disposition: A | Discharge status: Home / Self Care | Payer: BC Managed Care – PPO | Source: Ambulatory Visit | Attending: Family Medicine | Admitting: Family Medicine

## 2018-08-03 DIAGNOSIS — Z1231 Encounter for screening mammogram for malignant neoplasm of breast: Secondary | ICD-10-CM

## 2018-09-25 IMAGING — MR MR ABDOMEN WO/W CM MRCP
13 of 22 series · 20 of 48 positions shown · IV contrast (Yes MH)
Comparison: Right upper quadrant ultrasound dated 11/13/2016. CT
abdomen/pelvis dated 10/26/2008.

CLINICAL DATA: Abdominal pain x2 days radiating to back,
nausea/vomiting. Abnormal gallbladder with possible CBD stone on
ultrasound.

EXAM:
MRI ABDOMEN WITHOUT AND WITH CONTRAST (INCLUDING MRCP)
TECHNIQUE: Multiplanar multisequence MR imaging of the abdomen was performed
both before and after the administration of intravenous contrast.
Heavily T2-weighted images of the biliary and pancreatic ducts were
obtained, and three-dimensional MRCP images were rendered by post
processing.
CONTRAST:  20mL MULTIHANCE GADOBENATE DIMEGLUMINE 529 MG/ML IV SOLN

[Series 8: cor ssfse rt · coronal · 6.0mm · 0.78mm/px · 1 of 37 slices shown]
[im 1/37]
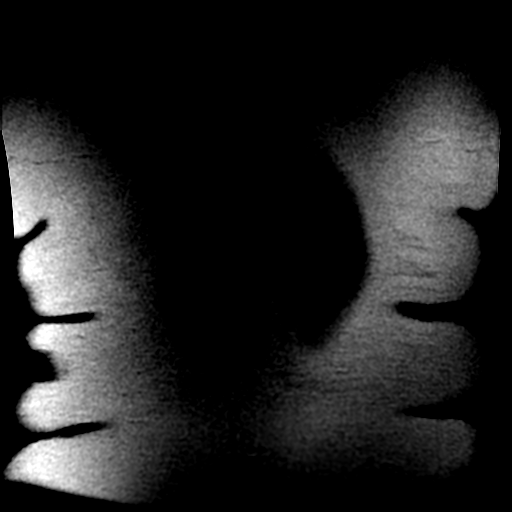

[Series 10: ax ssfse rt · axial · 7.0mm · 0.78mm/px · 1 of 40 slices shown]
[im 1/40]
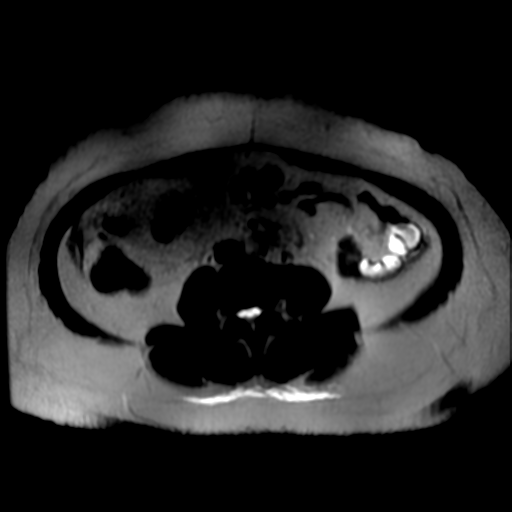

[Series 11: ax ssfse bh · axial · 6.0mm · 0.78mm/px · 1 of 47 slices shown]
[im 1/47]
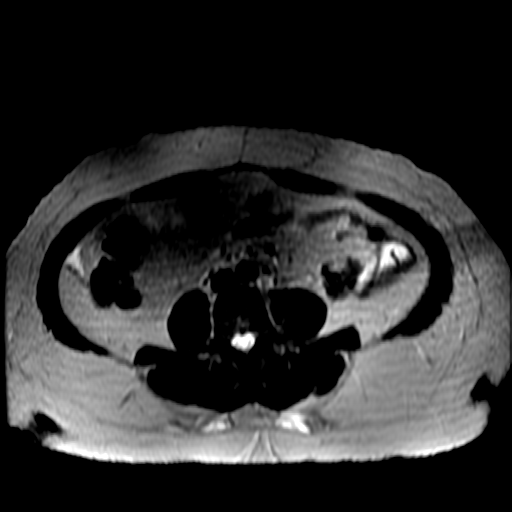

[Series 13: bSSFP · axial · 6.0mm · 0.78mm/px · 1 of 48 slices shown]
[im 1/48]
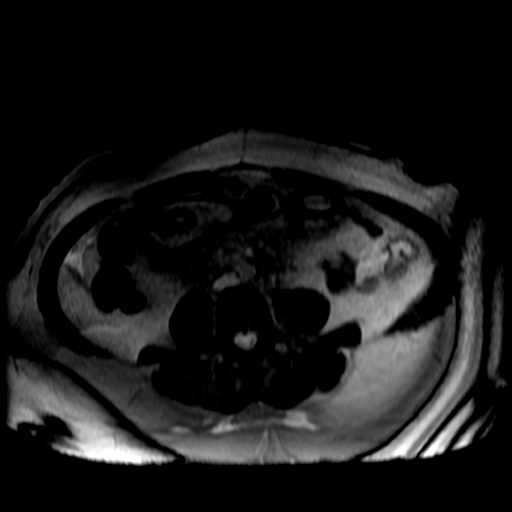

[Series 15: DWI b500 · axial · 8.0mm · 1.95mm/px · 1 of 64 slices shown]
[im 1/64]
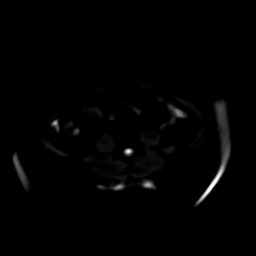

[Series 16: T2 fat-sat · axial · 7.0mm · 0.78mm/px · 1 of 40 slices shown]
[im 1/40]
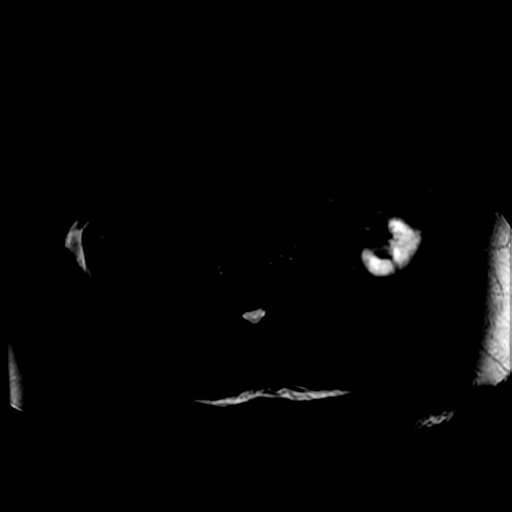

[Series 17: radial 2d thick · coronal · 40.0mm · 0.86mm/px · 1 of 6 slices shown]
[im 1/6]
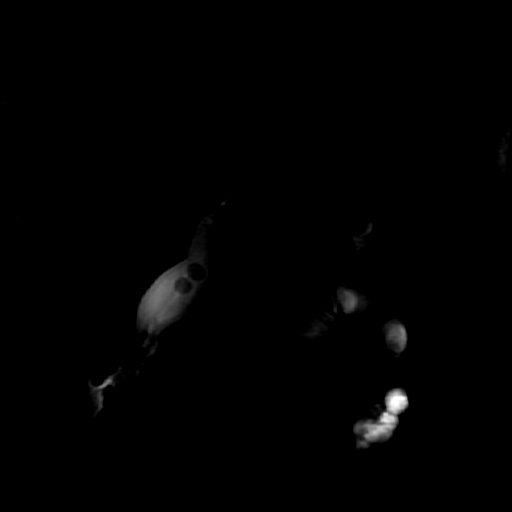

[Series 21: T1 dynamic · coronal · 4.0mm · 0.78mm/px · 2 of 60 slices shown (1 of 3)]
[im 1/60]
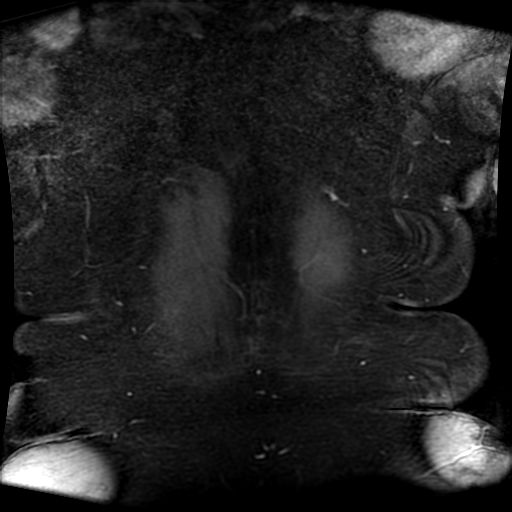
[im 60/60]
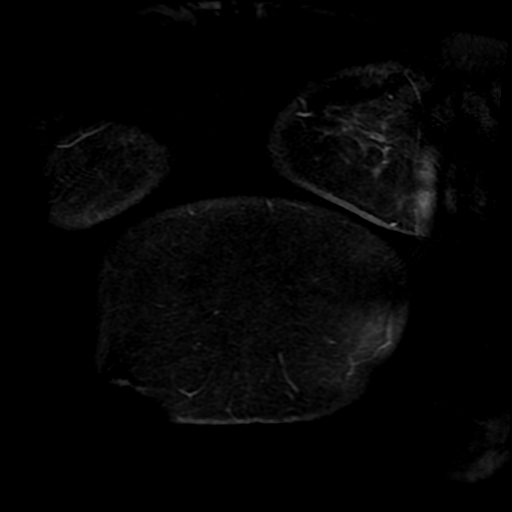

[Series 1550: ADC · axial · 8.0mm · 1.95mm/px · 1 of 32 slices shown]
[im 1/32]
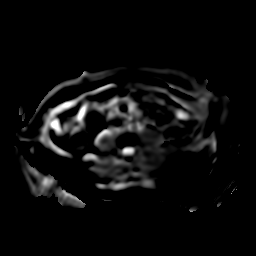

[Series 1850: processed images · sagittal · 1.8mm · 0.70mm/px · 1 of 13 slices shown]
[im 1/13]
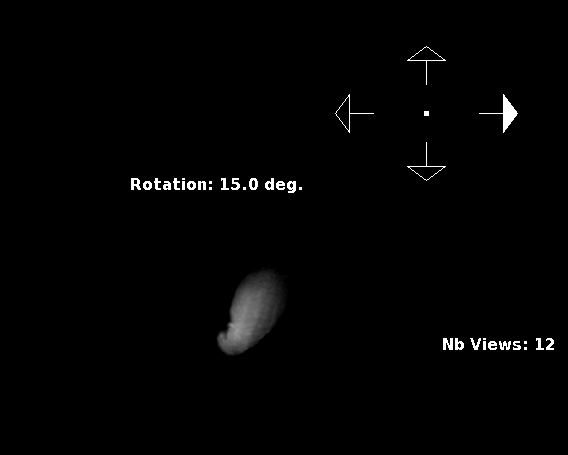

[Series 1901: T1 dynamic · axial · 4.0mm · 0.78mm/px · z∈[+23,+305]mm · 3 of 72 slices shown (2 of 3)]
[im 1/72]
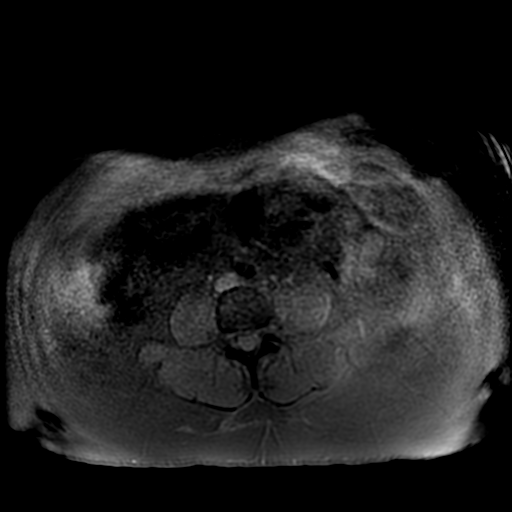
[im 36/72]
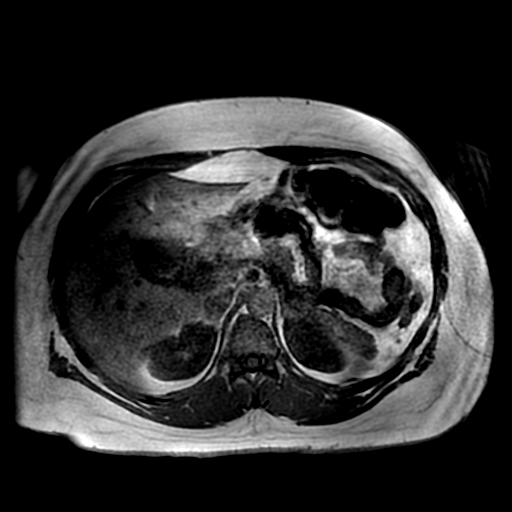
[im 72/72]
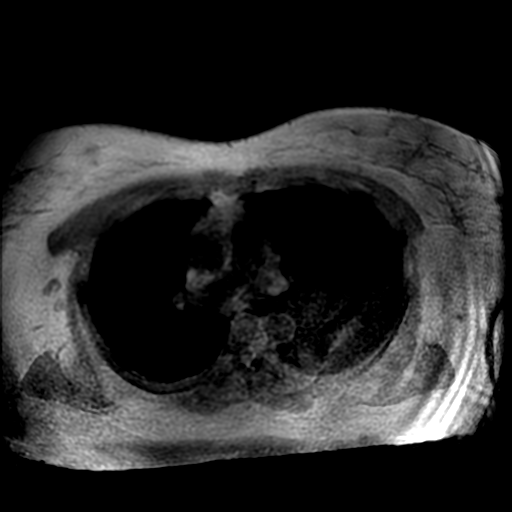

[Series 1902: T1 dynamic · axial · 4.0mm · 0.78mm/px · z∈[+23,+305]mm · 3 of 72 slices shown (3 of 3)]
[im 1/72]
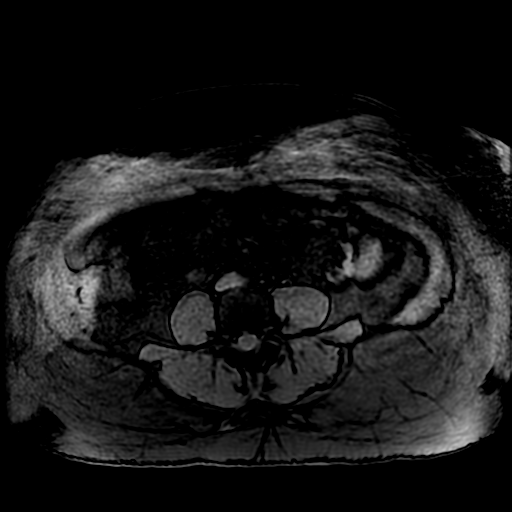
[im 36/72]
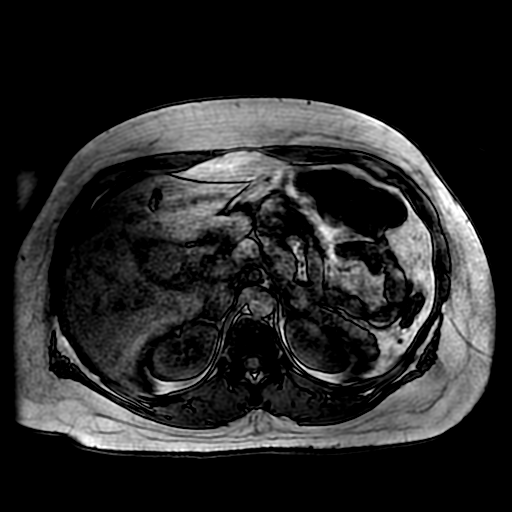
[im 72/72]
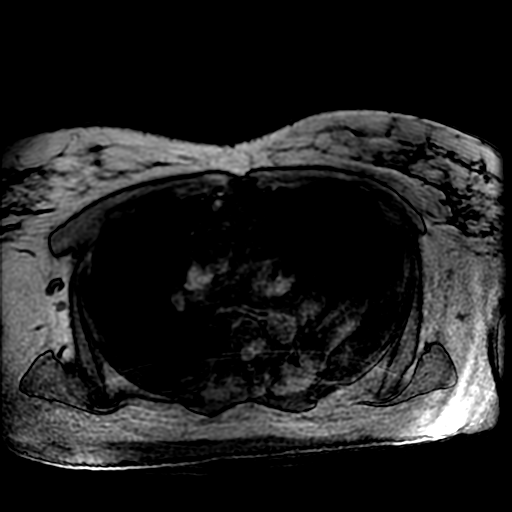

[Series 2000: T1 dynamic post-contrast · axial · non-contrast · 4.0mm · 0.78mm/px · z∈[+23,+305]mm · 3 of 72 slices shown]
[im 1/72]
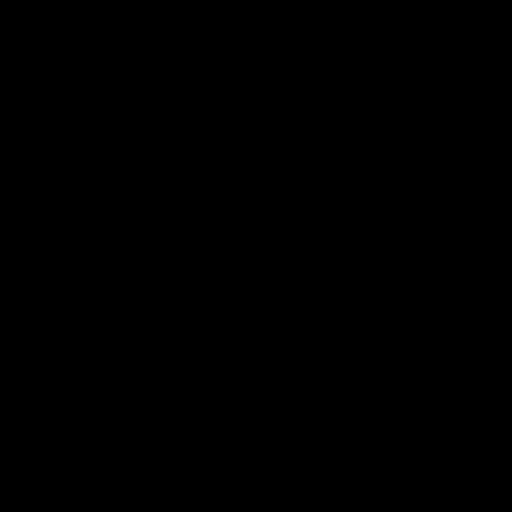
[im 36/72]
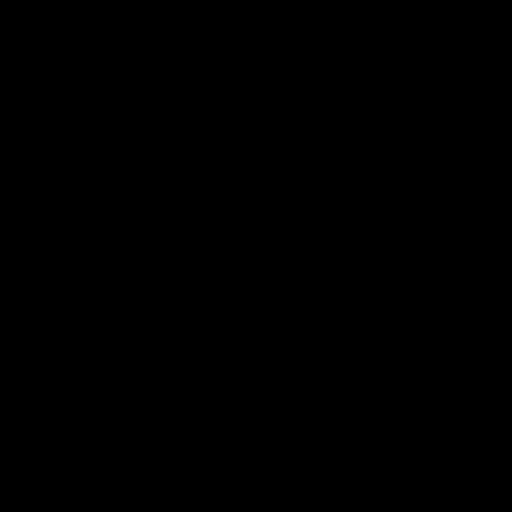
[im 72/72]
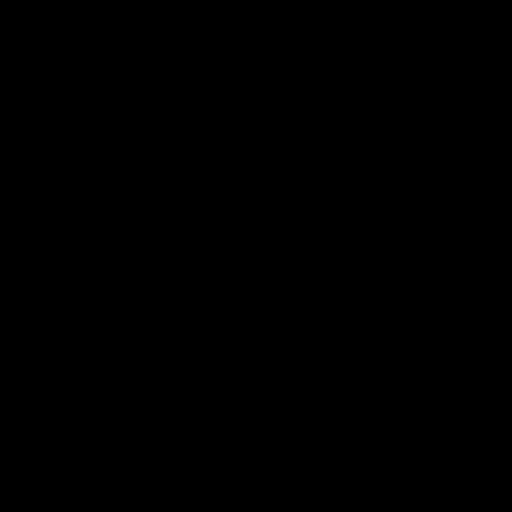

[20 of 48 positions shown; findings below may reference images not displayed]

FINDINGS: Lower chest: Lung bases are clear.

Hepatobiliary: Liver is within normal limits. No
suspicious/enhancing hepatic lesions.

Cholelithiasis, including a 1.6 cm stone in the gallbladder neck
(series 10/ image 16). Associated gallbladder wall thickening/
pericholecystic fluid, suggesting acute cholecystitis.

No intrahepatic or extrahepatic ductal dilatation. Common duct
measures 4 mm (series 8/image 21). No choledocholithiasis is seen.

Pancreas:  Within normal limits.

Spleen:  Within normal limits.

Adrenals/Urinary Tract:  Adrenal glands are within normal limits.

Kidneys are within normal limits.  No hydronephrosis.

Stomach/Bowel: Stomach is within normal limits.

Visualized bowel is unremarkable.

Vascular/Lymphatic:  No evidence of abdominal aortic aneurysm.

No suspicious abdominal lymphadenopathy.

Other: Mild perihepatic and perisplenic ascites. Additional fluid
along the hepatic flexure of the colon.

Musculoskeletal: No focal osseous lesions.
IMPRESSION: Cholelithiasis with acute cholecystitis.

No intrahepatic or extrahepatic ductal dilatation. Common duct
measures 4 mm. No choledocholithiasis is seen.

Reactive upper abdominal ascites.

## 2019-04-25 IMAGING — US US ABDOMEN LIMITED
1 series · 13 of 25 positions shown · non-contrast
Comparison: None.

CLINICAL DATA: Elevated liver enzymes. Right upper quadrant pain
and vomiting since [REDACTED].

EXAM:
US ABDOMEN LIMITED - RIGHT UPPER QUADRANT

[Series 1: us abdomen limited · 0.26mm/px · 13 of 59 slices shown]
[im 1/59]
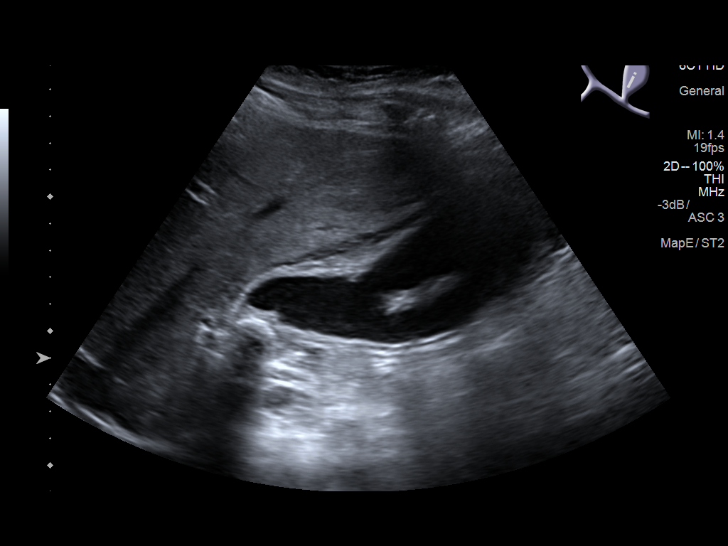
[im 5/59]
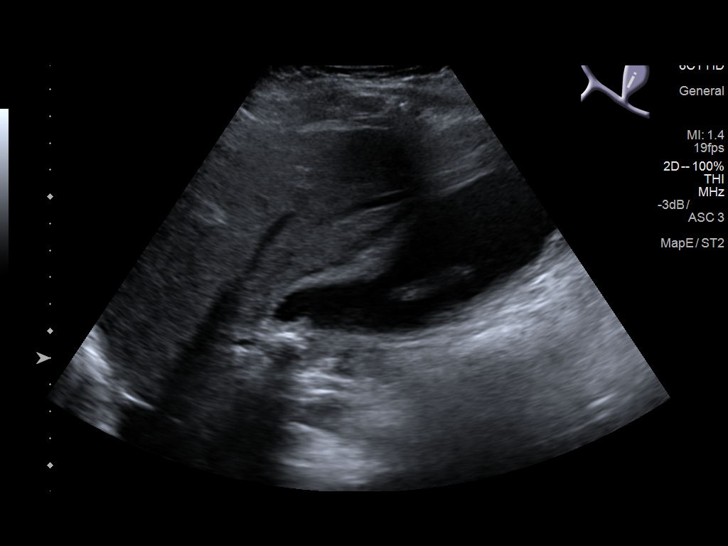
[im 10/59]
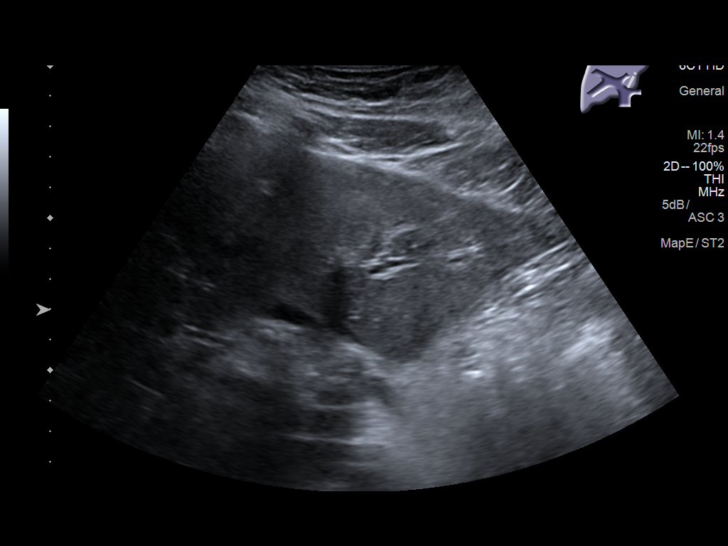
[im 15/59]
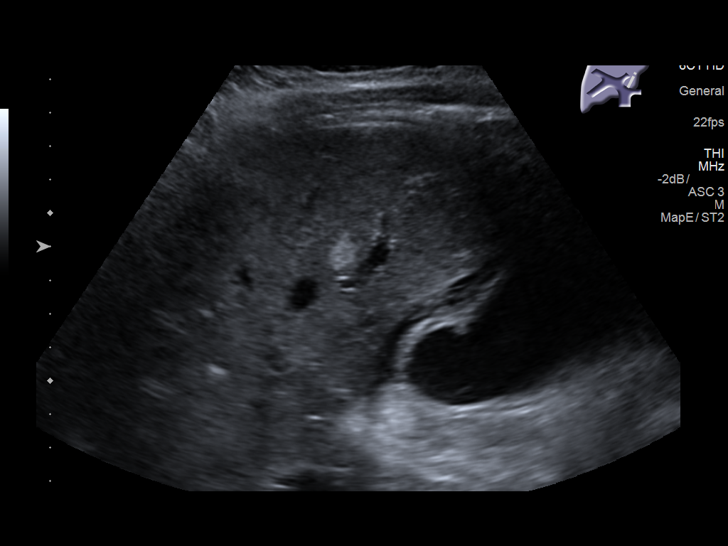
[im 20/59]
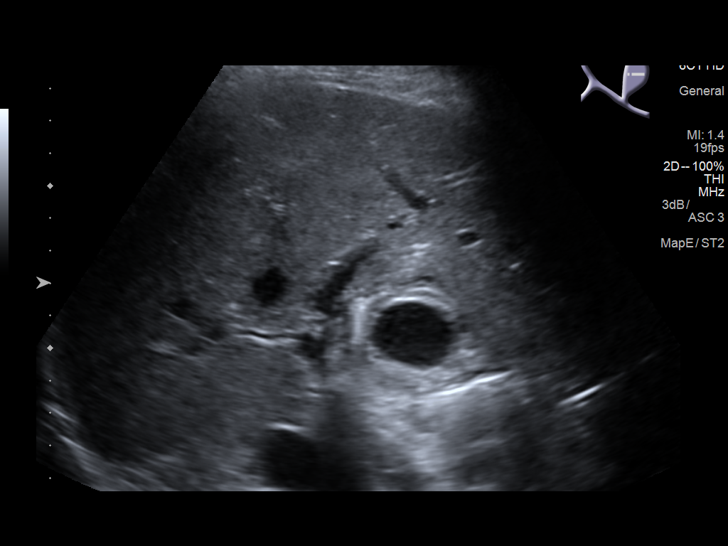
[im 25/59]
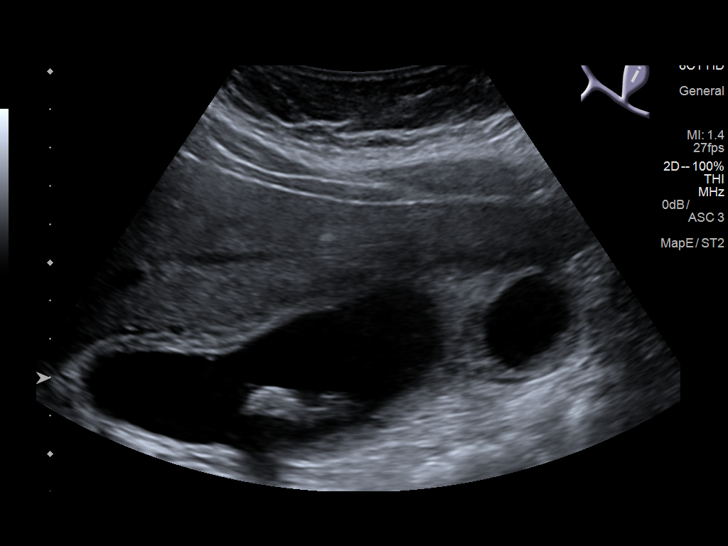
[im 30/59]
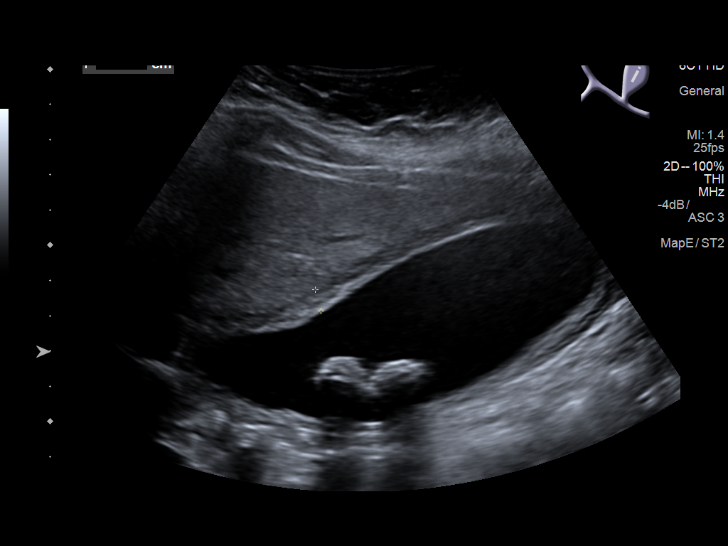
[im 34/59]
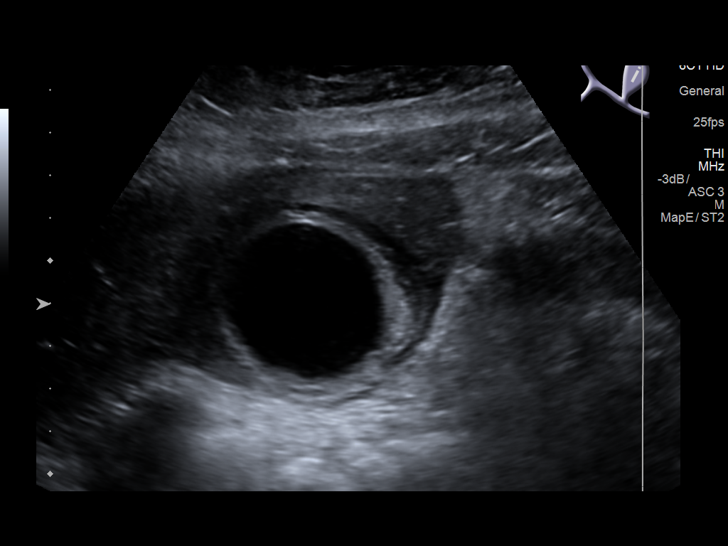
[im 39/59]
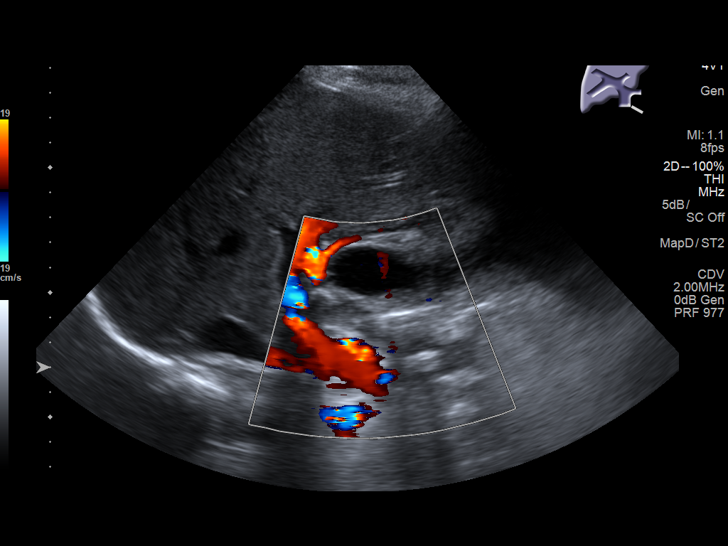
[im 44/59]
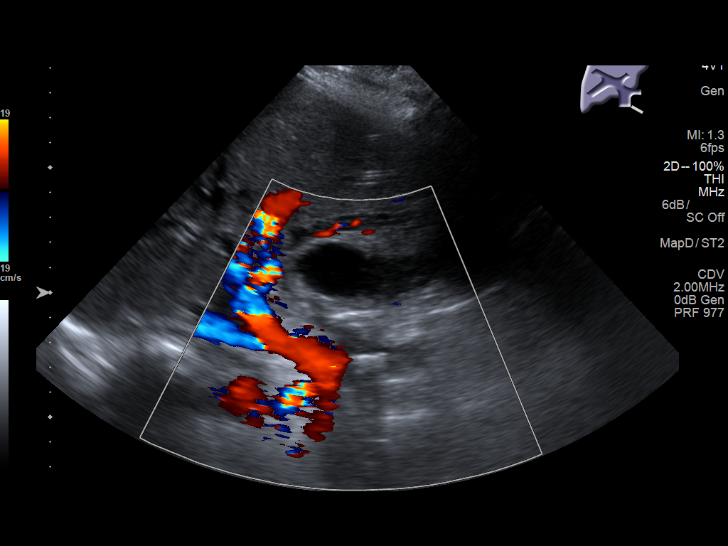
[im 49/59]
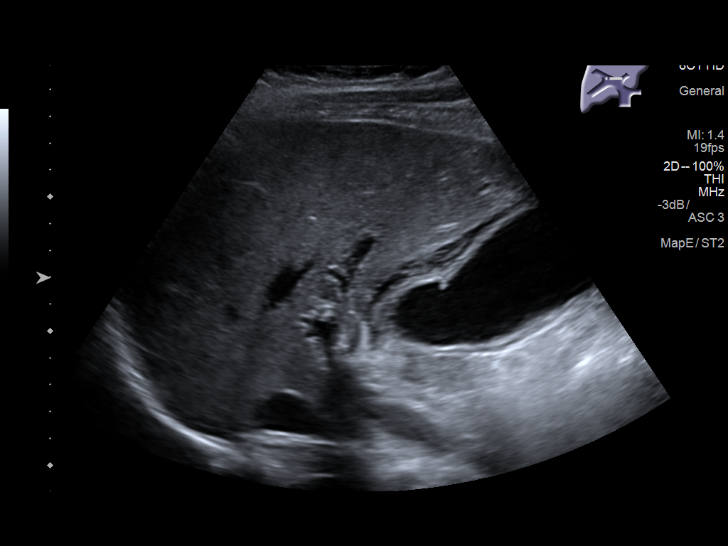
[im 54/59]
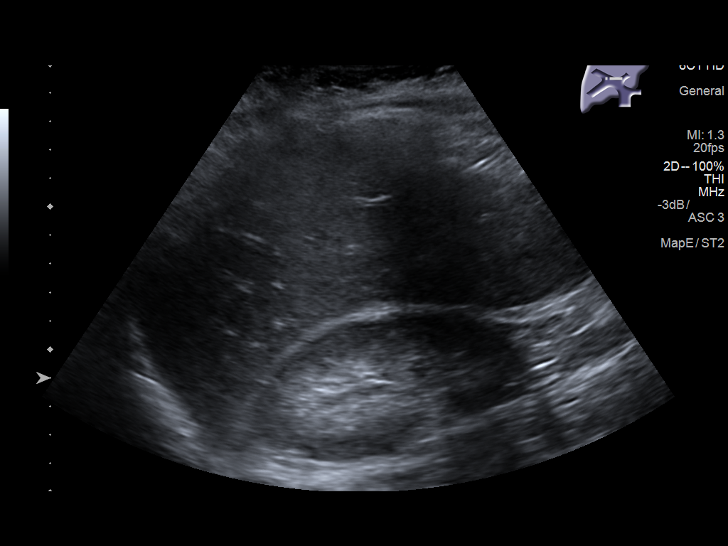
[im 59/59]
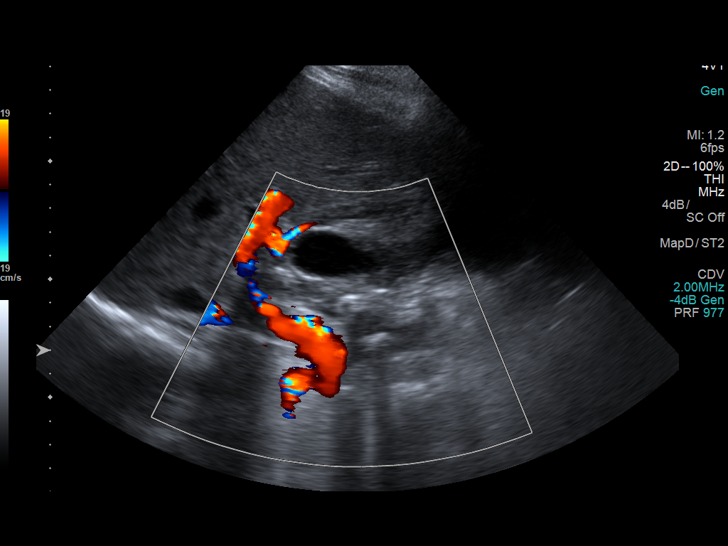

[13 of 25 positions shown; findings below may reference images not displayed]

FINDINGS: Gallbladder:

Multiple gallstones, largest measuring 2.2 cm. Gallbladder walls are
thickened circumferentially, with associated pericholecystic
fluid/edema.

Common bile duct:

Diameter: Presumed common bile duct stone measuring 1.6 cm greatest
dimension. Surrounding common bile duct not well seen, presumably
obscured by overlying bowel.

Liver:

Small echogenic focus within the right liver lobe measuring 11 mm,
too small to definitively characterize, most suggestive of benign
hemangioma. Liver otherwise unremarkable. Mild intrahepatic bile
duct dilatation within the left liver lobe.
IMPRESSION: 1. Gallbladder is distended and gallbladder walls are thickened
circumferentially, with surrounding pericholecystic fluid/edema,
consistent with acute cholecystitis. Also multiple gallstones,
largest measuring 2.2 cm.
2. Probable stone within the CBD measuring 1.6 cm. Surrounding CBD
not well seen, presumably obscured by overlying bowel gas. Consider
MRCP or ERCP for confirmation.
3. Associated mild intrahepatic bile duct dilatation within the left
liver lobe.

## 2019-06-07 ENCOUNTER — Other Ambulatory Visit: Payer: Self-pay

## 2019-06-07 DIAGNOSIS — Z20822 Contact with and (suspected) exposure to covid-19: Secondary | ICD-10-CM

## 2019-06-08 LAB — NOVEL CORONAVIRUS, NAA: SARS-CoV-2, NAA: NOT DETECTED

## 2019-06-26 ENCOUNTER — Other Ambulatory Visit: Payer: Self-pay | Admitting: *Deleted

## 2019-07-01 MED ORDER — RIVAROXABAN 20 MG PO TABS: 20.0000 mg | ORAL_TABLET | Freq: Every day | ORAL | 11 refills | Status: DC

## 2019-07-23 ENCOUNTER — Other Ambulatory Visit: Payer: Self-pay | Admitting: Family Medicine

## 2019-07-23 DIAGNOSIS — Z1231 Encounter for screening mammogram for malignant neoplasm of breast: Secondary | ICD-10-CM

## 2019-08-07 ENCOUNTER — Ambulatory Visit: Payer: BC Managed Care – PPO | Attending: Internal Medicine

## 2019-08-29 ENCOUNTER — Other Ambulatory Visit: Payer: Self-pay

## 2019-08-29 ENCOUNTER — Ambulatory Visit
Admission: RE | Admit: 2019-08-29 | Discharge: 2019-08-29 | Disposition: A | Discharge status: Home / Self Care | Payer: BC Managed Care – PPO | Source: Ambulatory Visit | Attending: Family Medicine | Admitting: Family Medicine

## 2019-08-29 DIAGNOSIS — Z1231 Encounter for screening mammogram for malignant neoplasm of breast: Secondary | ICD-10-CM

## 2019-09-10 ENCOUNTER — Encounter: Payer: Self-pay | Admitting: Family Medicine

## 2020-07-28 ENCOUNTER — Other Ambulatory Visit: Payer: Self-pay | Admitting: Family Medicine

## 2020-07-28 ENCOUNTER — Other Ambulatory Visit: Payer: Self-pay | Admitting: Physician Assistant

## 2020-07-28 DIAGNOSIS — Z1231 Encounter for screening mammogram for malignant neoplasm of breast: Secondary | ICD-10-CM

## 2020-09-09 ENCOUNTER — Other Ambulatory Visit: Payer: Self-pay

## 2020-09-09 ENCOUNTER — Ambulatory Visit
Admission: RE | Admit: 2020-09-09 | Discharge: 2020-09-09 | Disposition: A | Discharge status: Home / Self Care | Payer: BC Managed Care – PPO | Source: Ambulatory Visit | Attending: Physician Assistant | Admitting: Physician Assistant

## 2020-09-09 DIAGNOSIS — Z1231 Encounter for screening mammogram for malignant neoplasm of breast: Secondary | ICD-10-CM

## 2021-07-23 ENCOUNTER — Other Ambulatory Visit: Payer: Self-pay | Admitting: Family Medicine

## 2021-09-01 ENCOUNTER — Other Ambulatory Visit: Payer: Self-pay | Admitting: Physician Assistant

## 2021-09-01 DIAGNOSIS — Z1231 Encounter for screening mammogram for malignant neoplasm of breast: Secondary | ICD-10-CM

## 2021-09-21 ENCOUNTER — Ambulatory Visit
Admission: RE | Admit: 2021-09-21 | Discharge: 2021-09-21 | Disposition: A | Discharge status: Home / Self Care | Payer: BC Managed Care – PPO | Source: Ambulatory Visit | Attending: Physician Assistant | Admitting: Physician Assistant

## 2021-09-21 DIAGNOSIS — Z1231 Encounter for screening mammogram for malignant neoplasm of breast: Secondary | ICD-10-CM

## 2022-05-25 ENCOUNTER — Emergency Department (EMERGENCY_DEPARTMENT_HOSPITAL): Payer: BC Managed Care – PPO

## 2022-05-25 ENCOUNTER — Emergency Department (HOSPITAL_COMMUNITY): Payer: BC Managed Care – PPO

## 2022-05-25 ENCOUNTER — Emergency Department (HOSPITAL_COMMUNITY)
Admission: EM | Admit: 2022-05-25 | Discharge: 2022-05-25 | Disposition: A | Discharge status: Home / Self Care | Payer: BC Managed Care – PPO | Attending: Emergency Medicine | Admitting: Emergency Medicine

## 2022-05-25 DIAGNOSIS — M5442 Lumbago with sciatica, left side: Secondary | ICD-10-CM | POA: Diagnosis not present

## 2022-05-25 DIAGNOSIS — M5441 Lumbago with sciatica, right side: Secondary | ICD-10-CM | POA: Insufficient documentation

## 2022-05-25 DIAGNOSIS — M7989 Other specified soft tissue disorders: Secondary | ICD-10-CM | POA: Insufficient documentation

## 2022-05-25 DIAGNOSIS — M25551 Pain in right hip: Secondary | ICD-10-CM

## 2022-05-25 DIAGNOSIS — M79651 Pain in right thigh: Secondary | ICD-10-CM

## 2022-05-25 DIAGNOSIS — Z7901 Long term (current) use of anticoagulants: Secondary | ICD-10-CM | POA: Diagnosis not present

## 2022-05-25 DIAGNOSIS — M5126 Other intervertebral disc displacement, lumbar region: Secondary | ICD-10-CM | POA: Diagnosis not present

## 2022-05-25 DIAGNOSIS — M5136 Other intervertebral disc degeneration, lumbar region: Secondary | ICD-10-CM

## 2022-05-25 DIAGNOSIS — M545 Low back pain, unspecified: Secondary | ICD-10-CM | POA: Diagnosis present

## 2022-05-25 DIAGNOSIS — M5431 Sciatica, right side: Secondary | ICD-10-CM

## 2022-05-25 LAB — CBC WITH DIFFERENTIAL/PLATELET
Abs Immature Granulocytes: 0.01 10*3/uL (ref 0.00–0.07)
Basophils Absolute: 0.1 10*3/uL (ref 0.0–0.1)
Basophils Relative: 1 %
Eosinophils Absolute: 0.2 10*3/uL (ref 0.0–0.5)
Eosinophils Relative: 2 %
HCT: 37.7 % (ref 36.0–46.0)
Hemoglobin: 11.6 g/dL — ABNORMAL LOW (ref 12.0–15.0)
Immature Granulocytes: 0 %
Lymphocytes Relative: 38 %
Lymphs Abs: 3.1 10*3/uL (ref 0.7–4.0)
MCH: 25.9 pg — ABNORMAL LOW (ref 26.0–34.0)
MCHC: 30.8 g/dL (ref 30.0–36.0)
MCV: 84.2 fL (ref 80.0–100.0)
Monocytes Absolute: 0.7 10*3/uL (ref 0.1–1.0)
Monocytes Relative: 9 %
Neutro Abs: 4 10*3/uL (ref 1.7–7.7)
Neutrophils Relative %: 50 %
Platelets: 474 10*3/uL — ABNORMAL HIGH (ref 150–400)
RBC: 4.48 MIL/uL (ref 3.87–5.11)
RDW: 13.3 % (ref 11.5–15.5)
WBC: 8.1 10*3/uL (ref 4.0–10.5)
nRBC: 0 % (ref 0.0–0.2)

## 2022-05-25 LAB — BASIC METABOLIC PANEL
Anion gap: 5 (ref 5–15)
BUN: 12 mg/dL (ref 6–20)
CO2: 26 mmol/L (ref 22–32)
Calcium: 9 mg/dL (ref 8.9–10.3)
Chloride: 106 mmol/L (ref 98–111)
Creatinine, Ser: 0.86 mg/dL (ref 0.44–1.00)
GFR, Estimated: >60 mL/min (ref >60)
Glucose, Bld: 96 mg/dL (ref 70–99)
Potassium: 4.3 mmol/L (ref 3.5–5.1)
Sodium: 137 mmol/L (ref 135–145)

## 2022-05-25 MED ORDER — METHYLPREDNISOLONE 4 MG PO TBPK: ORAL_TABLET | ORAL | 0 refills | Status: AC

## 2022-05-25 MED ORDER — OXYCODONE-ACETAMINOPHEN 5-325 MG PO TABS: 1.0000 | ORAL_TABLET | ORAL | 0 refills | Status: AC | PRN

## 2022-05-25 MED ORDER — MORPHINE SULFATE (PF) 4 MG/ML IV SOLN
4.0000 mg | Freq: Once | INTRAVENOUS | Status: AC
  Administered 2022-05-25: 4 mg via INTRAVENOUS
  Filled 2022-05-25: qty 1

## 2022-05-25 MED ORDER — OXYCODONE-ACETAMINOPHEN 5-325 MG PO TABS
1.0000 | ORAL_TABLET | Freq: Once | ORAL | Status: AC
  Administered 2022-05-25: 1 via ORAL
  Filled 2022-05-25: qty 1

## 2022-05-25 NOTE — ED Notes (Signed)
Pt transported to MRI 

## 2022-05-25 NOTE — Discharge Instructions (Addendum)
Your history, exam, work-up today did not show evidence of blood clot in the leg but did show evidence of a bulging disc as the likely cause of pain.  I spoke to the neurosurgeon who recommended you get seen in clinic next week and do a steroid taper and pain medicine at home.  We will give you these prescriptions and have you follow-up with neurosurgery.  If any symptoms change or worsen acutely, please return to the nearest emergency department.

## 2022-05-25 NOTE — ED Provider Notes (Signed)
[facility] EMERGENCY DEPARTMENT Provider Note   CSN: [id number] Arrival date & time: 05/25/22  1515     History {Add pertinent medical, surgical, social history, OB history to HPI:1} Chief Complaint  Patient presents with   Leg Swelling    Carla Alexander is a 51 y.o. female.  HPI     Home Medications Prior to Admission medications   Medication Sig Start Date End Date Taking? Authorizing Provider  acetaminophen (TYLENOL) 500 MG tablet Take 1,000 mg by mouth every 6 (six) hours as needed for fever (pain).    [provider]  Cholecalciferol ([employer] VITAMIN D3) 1000 units capsule Take 1 capsule (1,000 Units total) by mouth daily. 10/24/16   Steve Rattler, DO  doxycycline (VIBRA-TABS) 100 MG tablet Take 1 tablet (100 mg total) by mouth 2 (two) times daily. 08/09/17   Zenia Resides, MD  polyethylene glycol (MIRALAX / GLYCOLAX) packet Take 17 g by mouth daily. 11/30/16   Bufford Lope, DO  senna (SENOKOT) 8.6 MG TABS tablet Take 1 tablet (8.6 mg total) by mouth at bedtime. 11/18/16   Steve Rattler, DO  triamcinolone ointment (KENALOG) 0.1 % Apply 1 application topically 2 (two) times daily. 03/27/17   Steve Rattler, DO  XARELTO 20 MG TABS tablet TAKE 1 TABLET (20 MG TOTAL) BY MOUTH DAILY WITH SUPPER. 07/28/20   Lyndee Hensen, DO  warfarin (COUMADIN) 10 MG tablet Take 1 to 1.5 tabs po daily as prescribed in clinic 12/31/10 09/08/11  Alveda Reasons, MD      Allergies    Patient has no known allergies.    Review of Systems   Review of Systems  Physical Exam Updated Vital Signs BP 130/89 (BP Location: Right Arm)   Pulse 77   Temp 98.4 F (36.9 C) (Oral)   Resp 16   LMP 11/01/2008   SpO2 98%  Physical Exam  ED Results / Procedures / Treatments   Labs (all labs ordered are listed, but only abnormal results are displayed) Labs Reviewed  CBC WITH DIFFERENTIAL/PLATELET - Abnormal; Notable for the following components:      Result Value    Hemoglobin 11.6 (*)    MCH 25.9 (*)    Platelets 474 (*)    All other components within normal limits  BASIC METABOLIC PANEL    EKG EKG Interpretation  Date/Time:  Wednesday May 25 2022 15:44:08 EST Ventricular Rate:  81 PR Interval:  154 QRS Duration: 88 QT Interval:  374 QTC Calculation: 434 R Axis:   33 Text Interpretation: Normal sinus rhythm Nonspecific T wave abnormality Abnormal ECG When compared with ECG of 06-Sep-2015 20:54, PREVIOUS ECG IS PRESENT when compared to prior, similar appearance. NO STEMI Confirmed by Antony Blackbird [phone]) on 05/25/2022 5:54:28 PM  Radiology VAS [place] LOWER EXTREMITY VENOUS (DVT) (7a-7p)  Result Date: 05/25/2022  Lower Venous DVT Study Patient Name:  Carla Alexander  Date of Exam:   05/25/2022 Medical Rec #: [id number]         Accession #:    [id number] Date of Birth: 08-29-1970         Patient Gender: F Patient Age:   89 years Exam Location:  [facility] Procedure:      VAS [place] LOWER EXTREMITY VENOUS (DVT) Referring Phys: HALEY SAGE --------------------------------------------------------------------------------  Indications: Right lateral hip and thigh pain. Other Indications: History of DVT/PE contralateral leg. Anticoagulation: Xarelto. Comparison Study: 08-23-2017 Prior left lower extremity venous was negative for  DVT.                    11-16-2016 Prior bilateral lower extremity venous was negative                   for DVT. Performing Technologist: Darlin Coco RDMS, RVT  Examination Guidelines: A complete evaluation includes B-mode imaging, spectral Doppler, color Doppler, and power Doppler as needed of all accessible portions of each vessel. Bilateral testing is considered an integral part of a complete examination. Limited examinations for reoccurring indications may be performed as noted. The reflux portion of the exam is performed with the patient in reverse Trendelenburg.   +---------+---------------+---------+-----------+----------+--------------+ RIGHT    CompressibilityPhasicitySpontaneityPropertiesThrombus Aging +---------+---------------+---------+-----------+----------+--------------+ CFV      Full           Yes      Yes                                 +---------+---------------+---------+-----------+----------+--------------+ SFJ      Full                                                        +---------+---------------+---------+-----------+----------+--------------+ FV Prox  Full                                                        +---------+---------------+---------+-----------+----------+--------------+ FV Mid   Full                                                        +---------+---------------+---------+-----------+----------+--------------+ FV DistalFull                                                        +---------+---------------+---------+-----------+----------+--------------+ PFV      Full                                                        +---------+---------------+---------+-----------+----------+--------------+ POP      Full           Yes      Yes                                 +---------+---------------+---------+-----------+----------+--------------+ PTV      Full                                                        +---------+---------------+---------+-----------+----------+--------------+  PERO     Full                                                        +---------+---------------+---------+-----------+----------+--------------+ Gastroc  Full                                                        +---------+---------------+---------+-----------+----------+--------------+   +----+---------------+---------+-----------+----------+--------------+ LEFTCompressibilityPhasicitySpontaneityPropertiesThrombus Aging  +----+---------------+---------+-----------+----------+--------------+ CFV Full           Yes      Yes                                 +----+---------------+---------+-----------+----------+--------------+     Summary: RIGHT: - There is no evidence of deep vein thrombosis in the lower extremity.  - No cystic structure found in the popliteal fossa.  LEFT: - No evidence of common femoral vein obstruction.  *See table(s) above for measurements and observations.    Preliminary     Procedures Procedures  {Document cardiac monitor, telemetry assessment procedure when appropriate:1}  Medications Ordered in ED Medications  morphine (PF) 4 MG/ML injection 4 mg (4 mg Intravenous Given 05/25/22 1825)    ED Course/ Medical Decision Making/ A&P                           Medical Decision Making Amount and/or Complexity of Data Reviewed Radiology: ordered.  Risk Prescription drug management.     ADYSSON REVELLE is a 51 y.o. female      {Document critical care time when appropriate:1} {Document review of labs and clinical decision tools ie heart score, Chads2Vasc2 etc:1}  {Document your independent review of radiology images, and any outside records:1} {Document your discussion with family members, caretakers, and with consultants:1} {Document social determinants of health affecting pt's care:1} {Document your decision making why or why not admission, treatments were needed:1} Final Clinical Impression(s) / ED Diagnoses Final diagnoses:  None    Rx / DC Orders ED Discharge Orders     None

## 2022-05-25 NOTE — ED Provider Triage Note (Signed)
Emergency Medicine Provider Triage Evaluation Note  Carla Alexander , a 51 y.o. female  was evaluated in triage.  Pt complains of right lower extremity pain and swelling x 1 week.  Denies any recent traumas or injuries.  Not having any fevers, chest pain or shortness of breath.  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  Review of Systems  Per HPI  Physical Exam  BP 130/89 (BP Location: Right Arm)   Pulse 77   Temp 98.4 F (36.9 C) (Oral)   Resp 16   LMP 11/01/2008   SpO2 98%  Gen:   Awake, no distress   Resp:  Normal effort  MSK:   Moves extremities without difficulty  Other:  Palpable DP PT.  Tenderness to palpation right lower calf.  Medical Decision Making  Medically screening exam initiated at 3:39 PM.  Appropriate orders placed.  Carla Alexander was informed that the remainder of the evaluation will be completed by another provider, this initial triage assessment does not replace that evaluation, and the importance of remaining in the ED until their evaluation is complete.     Sherrill Raring, PA-C 05/25/22 1539

## 2022-05-25 NOTE — ED Triage Notes (Signed)
Patient here for evaluation of right leg pain and swelling that started approximately one week ago. Patient concerned for a DVT, history of DVT in left leg, on xarelto. Denies chest pain, denies shortness of breath.

## 2022-09-05 ENCOUNTER — Other Ambulatory Visit: Payer: Self-pay | Admitting: Physician Assistant

## 2022-09-05 DIAGNOSIS — Z1231 Encounter for screening mammogram for malignant neoplasm of breast: Secondary | ICD-10-CM

## 2022-10-05 ENCOUNTER — Other Ambulatory Visit: Payer: Self-pay

## 2022-10-05 ENCOUNTER — Encounter (HOSPITAL_COMMUNITY): Payer: Self-pay

## 2022-10-05 ENCOUNTER — Emergency Department (HOSPITAL_COMMUNITY)
Admission: EM | Admit: 2022-10-05 | Discharge: 2022-10-05 | Disposition: A | Discharge status: Home / Self Care | Payer: BC Managed Care – PPO | Attending: Emergency Medicine | Admitting: Emergency Medicine

## 2022-10-05 ENCOUNTER — Emergency Department (HOSPITAL_COMMUNITY): Payer: BC Managed Care – PPO

## 2022-10-05 DIAGNOSIS — M5412 Radiculopathy, cervical region: Secondary | ICD-10-CM | POA: Insufficient documentation

## 2022-10-05 DIAGNOSIS — M542 Cervicalgia: Secondary | ICD-10-CM | POA: Diagnosis present

## 2022-10-05 DIAGNOSIS — Z7901 Long term (current) use of anticoagulants: Secondary | ICD-10-CM | POA: Diagnosis not present

## 2022-10-05 MED ORDER — CYCLOBENZAPRINE HCL 10 MG PO TABS
10.0000 mg | ORAL_TABLET | Freq: Once | ORAL | Status: AC
  Administered 2022-10-05: 10 mg via ORAL
  Filled 2022-10-05: qty 1

## 2022-10-05 MED ORDER — CYCLOBENZAPRINE HCL 5 MG PO TABS: 5.0000 mg | ORAL_TABLET | Freq: Two times a day (BID) | ORAL | 0 refills | Status: AC | PRN

## 2022-10-05 MED ORDER — LIDOCAINE 5 % EX PTCH
1.0000 | MEDICATED_PATCH | CUTANEOUS | Status: DC
  Administered 2022-10-05: 1 via TRANSDERMAL
  Filled 2022-10-05: qty 1

## 2022-10-05 MED ORDER — KETOROLAC TROMETHAMINE 30 MG/ML IJ SOLN
30.0000 mg | Freq: Once | INTRAMUSCULAR | Status: AC
  Administered 2022-10-05: 30 mg via INTRAMUSCULAR
  Filled 2022-10-05: qty 1

## 2022-10-05 NOTE — ED Notes (Signed)
Assumed care of pt. A/O x4 w/ even and unlabored breathing. Will continue to monitor.

## 2022-10-05 NOTE — ED Provider Notes (Signed)
[facility] Provider Note   CSN: [id number] Arrival date & time: 10/05/22  1512     History  Chief Complaint  Patient presents with   Rt Shoulder   Back Pain    Carla Alexander is a 52 y.o. female with medical history of anxiety, DVT, fibroid, cholecystitis.  Patient presents to ED for evaluation of neck pain, right shoulder pain, right hand numbness.  Patient reports that beginning on Sunday she developed pain in her neck along with pain to her right shoulder and numbness in her right hand.  The patient denies any history of the same, denies history of diabetes.  Patient denies new sleeping positions to account for her neck pain.  The patient denies any trauma to account for the pain.  Patient states pain worse with, better with rest.  Patient reports that she has been taking Tylenol arthritis at home without relief.  The patient states that sometimes she will lose grip strength in her right hand causing her to drop objects.  The patient denies any pain in her wrist or elbow on the right.  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   Back Pain Associated symptoms: numbness        Home Medications Prior to Admission medications   Medication Sig Start Date End Date Taking? Authorizing Provider  cyclobenzaprine (FLEXERIL) 5 MG tablet Take 1 tablet (5 mg total) by mouth 2 (two) times daily as needed for muscle spasms. 10/05/22  Yes Azucena Cecil, PA-C  acetaminophen (TYLENOL) 500 MG tablet Take 1,000 mg by mouth every 6 (six) hours as needed for fever (pain).    [provider]  Cholecalciferol ([employer] VITAMIN D3) 1000 units capsule Take 1 capsule (1,000 Units total) by mouth daily. 10/24/16   Steve Rattler, DO  doxycycline (VIBRA-TABS) 100 MG tablet Take 1 tablet (100 mg total) by mouth 2 (two) times daily. 08/09/17   Zenia Resides, MD  methylPREDNISolone (MEDROL DOSEPAK) 4 MG TBPK tablet Please  follow directions on dosepak. 05/25/22   Tegeler, Gwenyth Allegra, MD  oxyCODONE-acetaminophen (PERCOCET/ROXICET) 5-325 MG tablet Take 1 tablet by mouth every 4 (four) hours as needed for severe pain. 05/25/22   Tegeler, Gwenyth Allegra, MD  polyethylene glycol (MIRALAX / Floria Raveling) packet Take 17 g by mouth daily. 11/30/16   Bufford Lope, DO  senna (SENOKOT) 8.6 MG TABS tablet Take 1 tablet (8.6 mg total) by mouth at bedtime. 11/18/16   Steve Rattler, DO  triamcinolone ointment (KENALOG) 0.1 % Apply 1 application topically 2 (two) times daily. 03/27/17   Steve Rattler, DO  XARELTO 20 MG TABS tablet TAKE 1 TABLET (20 MG TOTAL) BY MOUTH DAILY WITH SUPPER. 07/28/20   Lyndee Hensen, DO  warfarin (COUMADIN) 10 MG tablet Take 1 to 1.5 tabs po daily as prescribed in clinic 12/31/10 09/08/11  Alveda Reasons, MD      Allergies    Patient has no known allergies.    Review of Systems   Review of Systems  Musculoskeletal:  Positive for neck pain.  Neurological:  Positive for numbness. Negative for dizziness and speech difficulty.  All other systems reviewed and are negative.   Physical Exam Updated Vital Signs BP (!) 143/89   Pulse 83   Temp 99.1 F (37.3 C) (Oral)   Resp 18   Ht 5\' 8"  (1.727 m)   Wt 111.1 kg   LMP 11/01/2008  SpO2 99%   BMI 37.25 kg/m  Physical Exam Vitals and nursing note reviewed.  Constitutional:      General: She is not in acute distress.    Appearance: Normal appearance. She is not ill-appearing, toxic-appearing or diaphoretic.  HENT:     Head: Normocephalic and atraumatic.     Nose: Nose normal.     Mouth/Throat:     Mouth: Mucous membranes are moist.     Pharynx: Oropharynx is clear.  Eyes:     Extraocular Movements: Extraocular movements intact.     Conjunctiva/sclera: Conjunctivae normal.     Pupils: Pupils are equal, round, and reactive to light.  Neck:   Cardiovascular:     Rate and Rhythm: Normal rate and regular rhythm.  Pulmonary:     Effort:  Pulmonary effort is normal.     Breath sounds: Normal breath sounds. No wheezing.  Abdominal:     General: Abdomen is flat. Bowel sounds are normal.     Palpations: Abdomen is soft.     Tenderness: There is no abdominal tenderness.  Musculoskeletal:     Cervical back: Normal range of motion and neck supple.     Comments: Range of motion of right shoulder appreciated and maintained.  No reduced range of motion.  Nonfocal pain, no overlying skin change, no deformity.  Skin:    General: Skin is warm and dry.     Capillary Refill: Capillary refill takes less than 2 seconds.  Neurological:     General: No focal deficit present.     Mental Status: She is alert and oriented to person, place, and time.     GCS: GCS eye subscore is 4. GCS verbal subscore is 5. GCS motor subscore is 6.     Cranial Nerves: Cranial nerves 2-12 are intact. No cranial nerve deficit.     Sensory: Sensation is intact. No sensory deficit.     Motor: Motor function is intact. No weakness.     Comments: Equal grip strength bilateral upper extremities     ED Results / Procedures / Treatments   Labs (all labs ordered are listed, but only abnormal results are displayed) Labs Reviewed - No data to display  EKG None  Radiology DG Shoulder Right  Result Date: 10/05/2022 CLINICAL DATA:  Right shoulder pain. EXAM: RIGHT SHOULDER - 2+ VIEW COMPARISON:  None Available. FINDINGS: The glenohumeral and AC joints are maintained. No acute fracture. No abnormal soft tissue calcifications. No scapular or clavicle fracture. The right ribs are intact. The right lung is clear. IMPRESSION: No acute bony findings or significant degenerative changes. Electronically Signed   By: Marijo Sanes M.D.   On: 10/05/2022 16:43    Procedures Procedures   Medications Ordered in ED Medications  lidocaine (LIDODERM) 5 % 1 patch (1 patch Transdermal Patch Applied 10/05/22 1840)  ketorolac (TORADOL) 30 MG/ML injection 30 mg (30 mg Intramuscular  Given 10/05/22 1839)  cyclobenzaprine (FLEXERIL) tablet 10 mg (10 mg Oral Given 10/05/22 1839)    ED Course/ Medical Decision Making/ A&P  Medical Decision Making Amount and/or Complexity of Data Reviewed Radiology: ordered.  Risk Prescription drug management.   52 year old female presents to ED for evaluation.  Please see HPI for further details.  On examination the patient is afebrile and nontachycardic.  Lung sounds are clear bilaterally, she is not hypoxic.  Abdomen is soft and compressible throughout.  Neurological examination with no focal neurodeficits.  No reduced grip strength bilateral upper extremities.  Patient does have  positive Spurling maneuver concerning for radiculopathy.  Patient was complaining of right hand numbness however has no subjective numbness on examination.  Patient right shoulder with no overlying skin change, deformity, reduced range of motion.  Plain film imaging of patient right shoulder shows no acute process.  Patient provided 10 mg Flexeril, Toradol, Lidoderm patch.  On reassessment, patient reports her pain is decreased at this time.  Patient will be referred to neurosurgery at this time.  Patient advised return precautions and she voiced understanding.  Patient had all her questions answered to her satisfaction.  The patient is stable for discharge at this time.   Final Clinical Impression(s) / ED Diagnoses Final diagnoses:  Cervical radiculopathy    Rx / DC Orders ED Discharge Orders          Ordered    cyclobenzaprine (FLEXERIL) 5 MG tablet  2 times daily PRN        10/05/22 1950              Azucena Cecil, PA-C 10/05/22 1951    Tretha Sciara, MD 10/06/22 [phone]

## 2022-10-05 NOTE — Discharge Instructions (Addendum)
Return to the ED with any new or worsening signs or symptoms Please follow-up with neurosurgery.  Please call and make an appointment to be seen. Please read attached guide concerning cervical radiculopathy Please continue doing stretches at home.  Please continue taking ibuprofen or Tylenol every 6 hours as needed for pain.  He may also take the muscle relaxer that I prescribed you.  Please do not drive or operate heavy machinery under the influence of this medication as it will cause you to become sedated.

## 2022-10-05 NOTE — ED Triage Notes (Addendum)
Pt came in via POV d/t pain felt over her Rt shoulder area, from the mid back level over her shoulder to her mid chest area for the last 3 days, rates pain 7/10. Pt does reports Hx of sciatica & has been having flare-up. Also endorses strength decreasing in that Rt hand when she noticed she almost dropped her laptop while holding it in that hand, A/Ox4.

## 2022-10-11 ENCOUNTER — Other Ambulatory Visit: Payer: Self-pay

## 2022-10-11 ENCOUNTER — Emergency Department (HOSPITAL_COMMUNITY)
Admission: EM | Admit: 2022-10-11 | Discharge: 2022-10-11 | Disposition: A | Discharge status: Home / Self Care | Payer: BC Managed Care – PPO | Attending: Emergency Medicine | Admitting: Emergency Medicine

## 2022-10-11 DIAGNOSIS — Z7901 Long term (current) use of anticoagulants: Secondary | ICD-10-CM | POA: Diagnosis not present

## 2022-10-11 DIAGNOSIS — M5412 Radiculopathy, cervical region: Secondary | ICD-10-CM | POA: Diagnosis not present

## 2022-10-11 DIAGNOSIS — M79602 Pain in left arm: Secondary | ICD-10-CM | POA: Diagnosis present

## 2022-10-11 NOTE — ED Triage Notes (Signed)
C/o right shoulder pain radiating down arm into fingers.  Pain increases with movement and holding objects.  Icy hot, lidocaine patch's, muscle relaxer, and tylenol w/o relief.  Pt reports seen for same on 10/05/22

## 2022-10-11 NOTE — Discharge Instructions (Signed)
Please follow-up with your primary care provider regarding recent ER visits and symptoms.  Please talk with your primary care provider about having an outpatient MRI for your cervical radiculopathy.  Please continue the supportive measures you are already taking and use lidocaine patches on your neck where you are having the pain.  If symptoms worsen please return to ER.

## 2022-10-11 NOTE — ED Provider Notes (Signed)
[place] EMERGENCY DEPARTMENT AT [facility] Provider Note   CSN: [id number] Arrival date & time: 10/11/22  1825     History  Chief Complaint  Patient presents with   Shoulder Pain    Carla Alexander is a 52 y.o. female history of DVT on chronic anticoagulation presenting with 2 weeks of right arm pain.  Patient was seen on 10/05/2022 and discharged with cervical radiculopathy with supportive measures and to follow-up outpatient for an MRI however pain has persisted and patient cannot see her primary care provider and return to the ER for an MRI of her right shoulder.  Patient states she has weakness in her right arm and that her right arm has a shooting pain if she moves her right shoulder the wrong way or her neck.  Patient denies any trauma recently.  Patient states pain makes her drop objects in her right hand.  Denies any arm swelling, change in sensation, fever/chills, nausea/vomiting, overlying skin color changes, recent IVs  Home Medications Prior to Admission medications   Medication Sig Start Date End Date Taking? Authorizing Provider  acetaminophen (TYLENOL) 500 MG tablet Take 1,000 mg by mouth every 6 (six) hours as needed for fever (pain).    [provider]  Cholecalciferol ([employer] VITAMIN D3) 1000 units capsule Take 1 capsule (1,000 Units total) by mouth daily. 10/24/16   Tillman Sers, DO  cyclobenzaprine (FLEXERIL) 5 MG tablet Take 1 tablet (5 mg total) by mouth 2 (two) times daily as needed for muscle spasms. 10/05/22   Al Decant, PA-C  doxycycline (VIBRA-TABS) 100 MG tablet Take 1 tablet (100 mg total) by mouth 2 (two) times daily. 08/09/17   Moses Manners, MD  methylPREDNISolone (MEDROL DOSEPAK) 4 MG TBPK tablet Please follow directions on dosepak. 05/25/22   Tegeler, Canary Brim, MD  oxyCODONE-acetaminophen (PERCOCET/ROXICET) 5-325 MG tablet Take 1 tablet by mouth every 4 (four) hours as needed for severe pain. 05/25/22   Tegeler,  Canary Brim, MD  polyethylene glycol (MIRALAX / Ethelene Hal) packet Take 17 g by mouth daily. 11/30/16   Leland Her, DO  senna (SENOKOT) 8.6 MG TABS tablet Take 1 tablet (8.6 mg total) by mouth at bedtime. 11/18/16   Tillman Sers, DO  triamcinolone ointment (KENALOG) 0.1 % Apply 1 application topically 2 (two) times daily. 03/27/17   Tillman Sers, DO  XARELTO 20 MG TABS tablet TAKE 1 TABLET (20 MG TOTAL) BY MOUTH DAILY WITH SUPPER. 07/28/20   Katha Cabal, DO  warfarin (COUMADIN) 10 MG tablet Take 1 to 1.5 tabs po daily as prescribed in clinic 12/31/10 09/08/11  Tobey Grim, MD      Allergies    Patient has no known allergies.    Review of Systems   Review of Systems See HPI Physical Exam Updated Vital Signs BP (!) 178/94   Pulse 96   Temp 98 F (36.7 C)   Resp 18   LMP 11/01/2008   SpO2 94%  Physical Exam Constitutional:      General: She is not in acute distress. Neck:     Comments: Nontender to palpation No midline tenderness or step-off/crepitus/or mass palpated Cardiovascular:     Pulses: Normal pulses.     Comments: 2+ bilateral radial pulse with regular rate Musculoskeletal:     Cervical back: Normal range of motion and neck supple.     Comments: No arm swelling 5 out of 5 bilateral grip strength Full active range of motion however  notes shooting pain going down her arm when she moves her right shoulder in any direction or when she turns her head to the right  Skin:    General: Skin is warm and dry.     Capillary Refill: Capillary refill takes less than 2 seconds.     Comments: No overlying skin color changes Vein is not tender to palpation  Neurological:     General: No focal deficit present.     Mental Status: She is alert and oriented to person, place, and time.     Comments: Positive Spurling test to right side Sensation intact distally  Psychiatric:        Mood and Affect: Mood normal.     ED Results / Procedures / Treatments   Labs (all  labs ordered are listed, but only abnormal results are displayed) Labs Reviewed - No data to display  EKG None  Radiology No results found.  Procedures Procedures    Medications Ordered in ED Medications - No data to display  ED Course/ Medical Decision Making/ A&P                             Medical Decision Making  Celine Ahrntonia M Hainer 52 y.o. presented today for right shoulder pain. Working DDx that I considered at this time includes, but not limited to, cervical radiculopathy, fracture, humeral dislocation, ischemic limb, neurovascular compromise, DVT, was superficial thrombophlebitis.  R/o DDx: fracture, humeral dislocation, ischemic limb, neurovascular compromise, DVT, was superficial thrombophlebitis: These are considered less likely due to history of present illness and physical exam findings  Review of prior external notes: 10/05/2022 ED  Unique Tests and My Interpretation: None  Discussion with Independent Historian: None  Discussion of Management of Tests: None  Risk: Low: based on diagnostic testing/clinical impression and treatment plan  Risk Stratification Score: none  Plan: Patient presented for right shoulder pain. On exam patient was in no acute distress and stable vitals.  Patient stated her symptoms have been the same since she was last discharged on 4/3 and wants an MRI today.  On exam patient is positive Spurling's test however rest of her exam was unremarkable including no tenderness to palpation of her veins, no recent IVs, no arm swelling, no changes in sensation/motor skills.  Patient had good pulses/sensation/motor skills in both hands distally.  I do not suspect a DVT or blood clot at this time considering patient's history and physical exam.  Patient did not have any abnormalities palpated on the neck or paracervical muscles.  At this time I suspect patient most likely has cervical radiculopathy and will benefit from outpatient MRI.  I spoke to the  patient extensively about how this is managed conservatively and that we do not do routinely do MRIs in the ED for cervical radiculopathy.  I spoke to the patient using lidocaine patches possibly on her neck instead of her right shoulder to see if that gives relief.  Patient will be discharged with outpatient follow-up and outpatient MRI as I believe she would benefit from that study in the outpatient setting.  Patient verbalized understanding of this plan.  Patient was given return precautions. Patient stable for discharge at this time.  Patient verbalized understanding of plan.         Final Clinical Impression(s) / ED Diagnoses Final diagnoses:  Cervical radiculopathy    Rx / DC Orders ED Discharge Orders     None  Remi Deter 10/11/22 2152    Charlynne Pander, MD 10/11/22 [phone]

## 2022-10-12 ENCOUNTER — Ambulatory Visit
Admission: RE | Admit: 2022-10-12 | Discharge: 2022-10-12 | Disposition: A | Discharge status: Home / Self Care | Payer: BC Managed Care – PPO | Source: Ambulatory Visit | Attending: Physician Assistant | Admitting: Physician Assistant

## 2022-10-12 DIAGNOSIS — Z1231 Encounter for screening mammogram for malignant neoplasm of breast: Secondary | ICD-10-CM

## 2022-10-21 ENCOUNTER — Other Ambulatory Visit: Payer: Self-pay

## 2022-10-21 ENCOUNTER — Emergency Department (HOSPITAL_COMMUNITY): Payer: BC Managed Care – PPO

## 2022-10-21 ENCOUNTER — Encounter (HOSPITAL_COMMUNITY): Payer: Self-pay

## 2022-10-21 ENCOUNTER — Emergency Department (HOSPITAL_COMMUNITY)
Admission: EM | Admit: 2022-10-21 | Discharge: 2022-10-22 | Disposition: A | Discharge status: Home / Self Care | Payer: BC Managed Care – PPO | Attending: Emergency Medicine | Admitting: Emergency Medicine

## 2022-10-21 DIAGNOSIS — Z7901 Long term (current) use of anticoagulants: Secondary | ICD-10-CM | POA: Insufficient documentation

## 2022-10-21 DIAGNOSIS — M5412 Radiculopathy, cervical region: Secondary | ICD-10-CM | POA: Insufficient documentation

## 2022-10-21 DIAGNOSIS — M79601 Pain in right arm: Secondary | ICD-10-CM | POA: Insufficient documentation

## 2022-10-21 DIAGNOSIS — R519 Headache, unspecified: Secondary | ICD-10-CM | POA: Insufficient documentation

## 2022-10-21 DIAGNOSIS — R0602 Shortness of breath: Secondary | ICD-10-CM | POA: Insufficient documentation

## 2022-10-21 LAB — BASIC METABOLIC PANEL
Anion gap: 10 (ref 5–15)
BUN: 12 mg/dL (ref 6–20)
CO2: 26 mmol/L (ref 22–32)
Calcium: 9.5 mg/dL (ref 8.9–10.3)
Chloride: 101 mmol/L (ref 98–111)
Creatinine, Ser: 0.83 mg/dL (ref 0.44–1.00)
GFR, Estimated: >60 mL/min (ref >60)
Glucose, Bld: 109 mg/dL — ABNORMAL HIGH (ref 70–99)
Potassium: 3.8 mmol/L (ref 3.5–5.1)
Sodium: 137 mmol/L (ref 135–145)

## 2022-10-21 LAB — CBC WITH DIFFERENTIAL/PLATELET
Abs Immature Granulocytes: 0.03 10*3/uL (ref 0.00–0.07)
Basophils Absolute: 0 10*3/uL (ref 0.0–0.1)
Basophils Relative: 0 %
Eosinophils Absolute: 0.1 10*3/uL (ref 0.0–0.5)
Eosinophils Relative: 1 %
HCT: 39.4 % (ref 36.0–46.0)
Hemoglobin: 12.6 g/dL (ref 12.0–15.0)
Immature Granulocytes: 0 %
Lymphocytes Relative: 40 %
Lymphs Abs: 3.6 10*3/uL (ref 0.7–4.0)
MCH: 25.9 pg — ABNORMAL LOW (ref 26.0–34.0)
MCHC: 32 g/dL (ref 30.0–36.0)
MCV: 81.1 fL (ref 80.0–100.0)
Monocytes Absolute: 0.8 10*3/uL (ref 0.1–1.0)
Monocytes Relative: 9 %
Neutro Abs: 4.4 10*3/uL (ref 1.7–7.7)
Neutrophils Relative %: 50 %
Platelets: 449 10*3/uL — ABNORMAL HIGH (ref 150–400)
RBC: 4.86 MIL/uL (ref 3.87–5.11)
RDW: 14.4 % (ref 11.5–15.5)
WBC: 8.9 10*3/uL (ref 4.0–10.5)
nRBC: 0 % (ref 0.0–0.2)

## 2022-10-21 LAB — BRAIN NATRIURETIC PEPTIDE: B Natriuretic Peptide: 5.8 pg/mL (ref 0.0–100.0)

## 2022-10-21 MED ORDER — PROCHLORPERAZINE EDISYLATE 10 MG/2ML IJ SOLN
10.0000 mg | Freq: Once | INTRAMUSCULAR | Status: AC
  Administered 2022-10-21: 10 mg via INTRAVENOUS
  Filled 2022-10-21: qty 2

## 2022-10-21 MED ORDER — ACETAMINOPHEN 500 MG PO TABS
1000.0000 mg | ORAL_TABLET | Freq: Once | ORAL | Status: AC
  Administered 2022-10-21: 1000 mg via ORAL
  Filled 2022-10-21: qty 2

## 2022-10-21 MED ORDER — LACTATED RINGERS IV BOLUS
1000.0000 mL | Freq: Once | INTRAVENOUS | Status: AC
  Administered 2022-10-21: 1000 mL via INTRAVENOUS

## 2022-10-21 MED ORDER — DIPHENHYDRAMINE HCL 50 MG/ML IJ SOLN
25.0000 mg | Freq: Once | INTRAMUSCULAR | Status: AC
  Administered 2022-10-21: 25 mg via INTRAVENOUS
  Filled 2022-10-21: qty 1

## 2022-10-21 NOTE — ED Triage Notes (Signed)
POV/ c/o Dizziness, SHOB starting today/ pt noticed swelling to right arm/ pt has hx of DVT's/ pt is concerned of clots/ on Xarelto

## 2022-10-21 NOTE — ED Provider Triage Note (Signed)
Emergency Medicine Provider Triage Evaluation Note  Carla Alexander , a 52 y.o. female  was evaluated in triage.  Pt complains of sob. Report R arm swelling and pain for several days.  Now having sob.  No fever, no injury, no n/v/d.  On xarelto  Review of Systems  Positive: As above Negative: As above  Physical Exam  BP (!) 159/109 (BP Location: Left Arm)   Pulse 78   Temp 98.2 F (36.8 C) (Oral)   Resp 16   Wt 114.3 kg   LMP 11/01/2008   SpO2 100%   BMI 38.32 kg/m  Gen:   Awake, no distress   Resp:  Normal effort  MSK:   Moves extremities without difficulty  Other:    Medical Decision Making  Medically screening exam initiated at 9:39 PM.  Appropriate orders placed.  Carla Alexander was informed that the remainder of the evaluation will be completed by another provider, this initial triage assessment does not replace that evaluation, and the importance of remaining in the ED until their evaluation is complete.     Fayrene Helper, PA-C 10/21/22 2140

## 2022-10-21 NOTE — ED Notes (Signed)
Pt arrived to room via wc from lobby c/o ha dizziness neck pain and right arm pain/swelling. Pt is on xarelto for hx of dvt and wants to make sure she does not have clot in arm. Pt reports not in right ac area. Full rom cms intact no visual difference in arm circmference noted

## 2022-10-22 ENCOUNTER — Emergency Department (HOSPITAL_COMMUNITY): Payer: BC Managed Care – PPO

## 2022-10-22 LAB — TROPONIN I (HIGH SENSITIVITY): Troponin I (High Sensitivity): 6 ng/L (ref <18)

## 2022-10-22 MED ORDER — GABAPENTIN 100 MG PO CAPS: 100.0000 mg | ORAL_CAPSULE | Freq: Three times a day (TID) | ORAL | 1 refills | Status: AC | PRN

## 2022-10-22 MED ORDER — IOHEXOL 350 MG/ML SOLN
75.0000 mL | Freq: Once | INTRAVENOUS | Status: AC | PRN
  Administered 2022-10-22: 75 mL via INTRAVENOUS

## 2022-10-22 NOTE — ED Notes (Signed)
Pt returned from MRI °

## 2022-10-22 NOTE — ED Provider Notes (Signed)
[facility] Emergency Department Provider Note MRN:  [id number]  Arrival date & time: 10/22/22     Chief Complaint   Shortness of Breath and Dizziness   History of Present Illness   Carla Alexander is a 52 y.o. year-old female with a history of DVT presenting to the ED with chief complaint of shortness of breath and dizziness.  Headache and lightheadedness for the past few days.  Shortness of breath today and also feels like her right arm is swollen.  Has been having right face, right arm, right leg sensation of swelling, discomfort for the past few days.  History of blood clots, worried she has another one.  Has been taking her Xarelto.  Review of Systems  A thorough review of systems was obtained and all systems are negative except as noted in the HPI and PMH.   Patient's Health History    Past Medical History:  Diagnosis Date   Anxiety    Cholecystitis 11/2016   DVT (deep venous thrombosis)    Fibroid     Past Surgical History:  Procedure Laterality Date   BREAST BIOPSY Left 2012/2016   x 2 benign   CHOLECYSTECTOMY N/A 11/15/2016   Procedure: LAPAROSCOPIC CHOLECYSTECTOMY;  Surgeon: Abigail Miyamoto, MD;  Location: [facility] OR;  Service: General;  Laterality: N/A;   TUBAL LIGATION     VAGINAL HYSTERECTOMY  2010   LAV[facility]    Family History  Problem Relation Age of Onset   Heart disease Mother    Diabetes Mother    Stroke Father    Diabetes Sister    Heart disease Sister    Heart disease Maternal Grandfather    Diabetes Maternal Grandfather    Cancer Maternal Grandfather        Colon   Diabetes Maternal Grandmother    Cancer Maternal Grandmother        Unknown type   Breast cancer Neg Hx     Social History   Socioeconomic History   Marital status: Married    Spouse name: Not on file   Number of children: 2   Years of education: Not on file   Highest education level: Not on file  Occupational History   Occupation: Runner, broadcasting/film/video Aide     Employer: [employer]  Tobacco Use   Smoking status: Never   Smokeless tobacco: Never  Vaping Use   Vaping Use: Never used  Substance and Sexual Activity   Alcohol use: No   Drug use: No   Sexual activity: Yes    Birth control/protection: Surgical  Other Topics Concern   Not on file  Social History Narrative   Not on file   Social Determinants of Health   Financial Resource Strain: Not on file  Food Insecurity: Not on file  Transportation Needs: Not on file  Physical Activity: Not on file  Stress: Not on file  Social Connections: Not on file  Intimate Partner Violence: Not on file     Physical Exam   Vitals:   10/22/22 0315 10/22/22 0521  BP: 109/76 112/72  Pulse:  76  Resp: 15 15  Temp:  98.3 F (36.8 C)  SpO2:  98%    CONSTITUTIONAL: Well-appearing, NAD NEURO/PSYCH:  Alert and oriented x 3, normal and symmetric strength and sensation, normal coordination, normal speech EYES:  eyes equal and reactive ENT/NECK:  no LAD, no JVD CARDIO: Regular rate, well-perfused, normal S1 and S2 PULM:  CTAB no wheezing or rhonchi GI/GU:  non-distended, non-tender  MSK/SPINE:  No gross deformities, no edema SKIN:  no rash, atraumatic   *Additional and/or pertinent findings included in MDM below  Diagnostic and Interventional Summary    EKG Interpretation  Date/Time:  Friday October 21 2022 22:12:49 EDT Ventricular Rate:  76 PR Interval:  146 QRS Duration: 88 QT Interval:  400 QTC Calculation: 450 R Axis:   37 Text Interpretation: Normal sinus rhythm Nonspecific T wave abnormality Abnormal ECG When compared with ECG of 25-May-2022 15:44, PREVIOUS ECG IS PRESENT Confirmed by Kennis Carina [phone]) on 10/21/2022 11:03:15 PM       Labs Reviewed  BASIC METABOLIC PANEL - Abnormal; Notable for the following components:      Result Value   Glucose, Bld 109 (*)    All other components within normal limits  CBC WITH DIFFERENTIAL/PLATELET - Abnormal; Notable for the following  components:   MCH 25.9 (*)    Platelets 449 (*)    All other components within normal limits  BRAIN NATRIURETIC PEPTIDE  TROPONIN I (HIGH SENSITIVITY)  TROPONIN I (HIGH SENSITIVITY)    MR Cervical Spine Wo Contrast  Final Result    CT Angio Chest PE W and/or Wo Contrast  Final Result    CT HEAD WO CONTRAST ( )  Final Result    CT CERVICAL SPINE WO CONTRAST  Final Result    DG Chest 2 View  Final Result      Medications  prochlorperazine (COMPAZINE) injection 10 mg (10 mg Intravenous Given 10/21/22 2339)  diphenhydrAMINE (BENADRYL) injection 25 mg (25 mg Intravenous Given 10/21/22 2339)  acetaminophen (TYLENOL) tablet 1,000 mg (1,000 mg Oral Given 10/21/22 2339)  lactated ringers bolus 1,000 mL (0 mLs Intravenous Stopped 10/22/22 0037)  iohexol (OMNIPAQUE) 350 MG/ML injection 75 mL (75 mLs Intravenous Contrast Given 10/22/22 0107)     Procedures  /  Critical Care Procedures  ED Course and Medical Decision Making  Initial Impression and Ddx Headache, dizziness described as a lightheadedness, some shortness of breath today.  She has been having some subjective sensation of swelling to right face, arm, leg though I see no objective swelling on exam.  Having some pain to the right side of her body as well, normal strength and sensation and coordination on exam.  Still suspicious for intracranial lesion, mass or bleeding, cervical spinal process also a consideration.  Regarding the shortness of breath and history of DVT, will obtain PE study.  Past medical/surgical history that increases complexity of ED encounter: DVT  Interpretation of Diagnostics I personally reviewed the EKG and my interpretation is as follows: Sinus rhythm  Labs reassuring with no significant blood count or electrolyte disturbance, troponin negative x 2.  CT imaging without evidence of PE.  There is evidence of degenerative disc disease in the neck with possible osteophyte causing issues at the C5-C6  location.  Patient Reassessment and Ultimate Disposition/Management     Shared decision making utilized with patient, given her continued right-sided symptoms will obtain MRI for further clarification of this cervical finding on CT.  MRI showing likely radiculopathy but no emergent process, no myelopathy.  Patient is comfortable being discharged home with neurosurgery follow-up.  She already tried the prednisone last week, did not get much benefit.  Will provide gabapentin for symptoms.  Patient management required discussion with the following services or consulting groups:  None  Complexity of Problems Addressed Acute illness or injury that poses threat of life of bodily function  Additional Data Reviewed and Analyzed Further history obtained from:  Further history from spouse/family member  Additional Factors Impacting ED Encounter Risk Prescriptions  Elmer Sow. Pilar Plate, MD [facility] Emergency Medicine [facility] [email or web address]  Final Clinical Impressions(s) / ED Diagnoses     ICD-10-CM   1. Acute nonintractable headache, unspecified headache type  R51.9     2. Pain of right upper extremity  M79.601     3. SOB (shortness of breath)  R06.02     4. Cervical radiculopathy  M54.12       ED Discharge Orders          Ordered    gabapentin (NEURONTIN) 100 MG capsule  3 times daily PRN        10/22/22 0519             Discharge Instructions Discussed with and Provided to Patient:     Discharge Instructions      You were evaluated in the Emergency Department and after careful evaluation, we did not find any emergent condition requiring admission or further testing in the hospital.  Symptoms seem to be due to a pinched nerve in the neck.  Recommend follow-up with the spine experts for further management.  Use the gabapentin as needed 3 times a day for symptoms.  Please return to the Emergency Department if you experience any worsening of your  condition.   Thank you for allowing [place] to be a part of your care.       Sabas Sous, MD 10/22/22 [phone]

## 2022-10-22 NOTE — ED Notes (Signed)
Pt reports ha has improved only having issue with possible swelling to palm of right hand

## 2022-10-22 NOTE — Discharge Instructions (Signed)
You were evaluated in the Emergency Department and after careful evaluation, we did not find any emergent condition requiring admission or further testing in the hospital.  Symptoms seem to be due to a pinched nerve in the neck.  Recommend follow-up with the spine experts for further management.  Use the gabapentin as needed 3 times a day for symptoms.  Please return to the Emergency Department if you experience any worsening of your condition.   Thank you for allowing [place] to be a part of your care.

## 2023-01-06 ENCOUNTER — Ambulatory Visit: Payer: BC Managed Care – PPO | Admitting: Family

## 2023-07-20 ENCOUNTER — Other Ambulatory Visit (HOSPITAL_COMMUNITY): Payer: Self-pay | Admitting: Physician Assistant

## 2023-07-20 ENCOUNTER — Encounter: Payer: Self-pay | Admitting: Physician Assistant

## 2023-07-20 DIAGNOSIS — R2231 Localized swelling, mass and lump, right upper limb: Secondary | ICD-10-CM

## 2023-07-24 ENCOUNTER — Encounter (HOSPITAL_COMMUNITY): Payer: Self-pay

## 2023-07-24 ENCOUNTER — Ambulatory Visit (HOSPITAL_COMMUNITY)
Admission: RE | Admit: 2023-07-24 | Discharge: 2023-07-24 | Disposition: A | Discharge status: Home / Self Care | Payer: 59 | Source: Ambulatory Visit | Attending: Physician Assistant | Admitting: Physician Assistant

## 2023-07-24 DIAGNOSIS — R2231 Localized swelling, mass and lump, right upper limb: Secondary | ICD-10-CM | POA: Diagnosis present

## 2023-07-24 LAB — POCT I-STAT CREATININE: Creatinine, Ser: 0.9 mg/dL (ref 0.44–1.00)

## 2023-07-24 MED ORDER — IOHEXOL 300 MG/ML  SOLN
75.0000 mL | Freq: Once | INTRAMUSCULAR | Status: AC | PRN
  Administered 2023-07-24: 75 mL via INTRAVENOUS

## 2023-10-26 ENCOUNTER — Other Ambulatory Visit: Payer: Self-pay | Admitting: Physician Assistant

## 2023-10-26 DIAGNOSIS — Z1231 Encounter for screening mammogram for malignant neoplasm of breast: Secondary | ICD-10-CM

## 2023-10-31 ENCOUNTER — Ambulatory Visit
Admission: RE | Admit: 2023-10-31 | Discharge: 2023-10-31 | Disposition: A | Discharge status: Home / Self Care | Source: Ambulatory Visit | Attending: Physician Assistant | Admitting: Physician Assistant

## 2023-10-31 DIAGNOSIS — Z1231 Encounter for screening mammogram for malignant neoplasm of breast: Secondary | ICD-10-CM
